# Patient Record
Sex: Female | Born: 1945 | Race: White | Hispanic: No | Smoking: Former smoker
Health system: Southern US, Community
[De-identification: ages and names within clinical notes are randomized; demographics above are authoritative.]

## PROBLEM LIST (undated history)

## (undated) DIAGNOSIS — Z800 Family history of malignant neoplasm of digestive organs: Secondary | ICD-10-CM

## (undated) DIAGNOSIS — R0609 Other forms of dyspnea: Secondary | ICD-10-CM

## (undated) DIAGNOSIS — E785 Hyperlipidemia, unspecified: Secondary | ICD-10-CM

## (undated) DIAGNOSIS — T7840XA Allergy, unspecified, initial encounter: Secondary | ICD-10-CM

## (undated) DIAGNOSIS — R519 Headache, unspecified: Secondary | ICD-10-CM

## (undated) DIAGNOSIS — E039 Hypothyroidism, unspecified: Secondary | ICD-10-CM

## (undated) DIAGNOSIS — Z8679 Personal history of other diseases of the circulatory system: Secondary | ICD-10-CM

## (undated) DIAGNOSIS — Z8041 Family history of malignant neoplasm of ovary: Secondary | ICD-10-CM

## (undated) DIAGNOSIS — G8929 Other chronic pain: Secondary | ICD-10-CM

## (undated) DIAGNOSIS — N200 Calculus of kidney: Secondary | ICD-10-CM

## (undated) DIAGNOSIS — M797 Fibromyalgia: Secondary | ICD-10-CM

## (undated) DIAGNOSIS — Z860100 Personal history of colon polyps, unspecified: Secondary | ICD-10-CM

## (undated) DIAGNOSIS — N390 Urinary tract infection, site not specified: Secondary | ICD-10-CM

## (undated) DIAGNOSIS — R0602 Shortness of breath: Secondary | ICD-10-CM

## (undated) DIAGNOSIS — R1319 Other dysphagia: Secondary | ICD-10-CM

## (undated) DIAGNOSIS — J189 Pneumonia, unspecified organism: Secondary | ICD-10-CM

## (undated) DIAGNOSIS — F419 Anxiety disorder, unspecified: Secondary | ICD-10-CM

## (undated) DIAGNOSIS — Z8042 Family history of malignant neoplasm of prostate: Secondary | ICD-10-CM

## (undated) DIAGNOSIS — F32A Depression, unspecified: Secondary | ICD-10-CM

## (undated) DIAGNOSIS — R0789 Other chest pain: Secondary | ICD-10-CM

## (undated) DIAGNOSIS — D649 Anemia, unspecified: Secondary | ICD-10-CM

## (undated) DIAGNOSIS — R51 Headache: Secondary | ICD-10-CM

## (undated) DIAGNOSIS — M1990 Unspecified osteoarthritis, unspecified site: Secondary | ICD-10-CM

## (undated) DIAGNOSIS — G43909 Migraine, unspecified, not intractable, without status migrainosus: Secondary | ICD-10-CM

## (undated) DIAGNOSIS — K219 Gastro-esophageal reflux disease without esophagitis: Secondary | ICD-10-CM

## (undated) DIAGNOSIS — H269 Unspecified cataract: Secondary | ICD-10-CM

## (undated) DIAGNOSIS — M810 Age-related osteoporosis without current pathological fracture: Secondary | ICD-10-CM

## (undated) DIAGNOSIS — K279 Peptic ulcer, site unspecified, unspecified as acute or chronic, without hemorrhage or perforation: Secondary | ICD-10-CM

## (undated) DIAGNOSIS — R0600 Dyspnea, unspecified: Secondary | ICD-10-CM

## (undated) HISTORY — DX: Peptic ulcer, site unspecified, unspecified as acute or chronic, without hemorrhage or perforation: K27.9

## (undated) HISTORY — PX: COLONOSCOPY: SHX174

## (undated) HISTORY — DX: Family history of malignant neoplasm of digestive organs: Z80.0

## (undated) HISTORY — DX: Gastro-esophageal reflux disease without esophagitis: K21.9

## (undated) HISTORY — DX: Age-related osteoporosis without current pathological fracture: M81.0

## (undated) HISTORY — DX: Headache: R51

## (undated) HISTORY — DX: Family history of malignant neoplasm of prostate: Z80.42

## (undated) HISTORY — DX: Calculus of kidney: N20.0

## (undated) HISTORY — DX: Unspecified cataract: H26.9

## (undated) HISTORY — PX: OTHER SURGICAL HISTORY: SHX169

## (undated) HISTORY — DX: Shortness of breath: R06.02

## (undated) HISTORY — DX: Pneumonia, unspecified organism: J18.9

## (undated) HISTORY — DX: Family history of malignant neoplasm of ovary: Z80.41

## (undated) HISTORY — DX: Hyperlipidemia, unspecified: E78.5

## (undated) HISTORY — DX: Personal history of colon polyps, unspecified: Z86.0100

## (undated) HISTORY — DX: Migraine, unspecified, not intractable, without status migrainosus: G43.909

## (undated) HISTORY — DX: Unspecified osteoarthritis, unspecified site: M19.90

## (undated) HISTORY — PX: TUBAL LIGATION: SHX77

## (undated) HISTORY — PX: CATARACT EXTRACTION: SUR2

## (undated) HISTORY — DX: Personal history of other diseases of the circulatory system: Z86.79

## (undated) HISTORY — DX: Urinary tract infection, site not specified: N39.0

## (undated) HISTORY — DX: Other dysphagia: R13.19

## (undated) HISTORY — PX: UPPER GASTROINTESTINAL ENDOSCOPY: SHX188

## (undated) HISTORY — DX: Depression, unspecified: F32.A

## (undated) HISTORY — DX: Anxiety disorder, unspecified: F41.9

## (undated) HISTORY — DX: Allergy, unspecified, initial encounter: T78.40XA

## (undated) HISTORY — DX: Headache, unspecified: R51.9

## (undated) HISTORY — DX: Fibromyalgia: M79.7

## (undated) HISTORY — DX: Other chronic pain: G89.29

## (undated) HISTORY — DX: Anemia, unspecified: D64.9

## (undated) HISTORY — DX: Other chest pain: R07.89

## (undated) HISTORY — DX: Dyspnea, unspecified: R06.00

## (undated) HISTORY — PX: POLYPECTOMY: SHX149

## (undated) HISTORY — DX: Hypothyroidism, unspecified: E03.9

## (undated) HISTORY — DX: Personal history of colonic polyps: Z86.010

## (undated) HISTORY — DX: Other forms of dyspnea: R06.09

---

## 1997-12-20 DIAGNOSIS — Z8679 Personal history of other diseases of the circulatory system: Secondary | ICD-10-CM

## 1997-12-20 HISTORY — DX: Personal history of other diseases of the circulatory system: Z86.79

## 1999-11-19 DIAGNOSIS — I471 Supraventricular tachycardia, unspecified: Secondary | ICD-10-CM | POA: Insufficient documentation

## 2002-04-24 DIAGNOSIS — F410 Panic disorder [episodic paroxysmal anxiety] without agoraphobia: Secondary | ICD-10-CM | POA: Insufficient documentation

## 2003-06-17 DIAGNOSIS — M549 Dorsalgia, unspecified: Secondary | ICD-10-CM | POA: Insufficient documentation

## 2005-01-18 DIAGNOSIS — M8580 Other specified disorders of bone density and structure, unspecified site: Secondary | ICD-10-CM | POA: Insufficient documentation

## 2005-07-19 ENCOUNTER — Ambulatory Visit: Payer: Self-pay | Admitting: Cardiology

## 2005-07-20 DIAGNOSIS — R0789 Other chest pain: Secondary | ICD-10-CM

## 2005-07-20 HISTORY — DX: Other chest pain: R07.89

## 2005-07-21 ENCOUNTER — Ambulatory Visit: Payer: Self-pay

## 2005-07-21 ENCOUNTER — Ambulatory Visit: Payer: Self-pay | Admitting: Cardiology

## 2006-01-04 ENCOUNTER — Ambulatory Visit: Payer: Self-pay | Admitting: Cardiology

## 2006-01-22 ENCOUNTER — Encounter: Admission: RE | Admit: 2006-01-22 | Discharge: 2006-01-22 | Payer: Self-pay | Admitting: Neurology

## 2006-05-02 ENCOUNTER — Ambulatory Visit: Payer: Self-pay | Admitting: Cardiology

## 2006-10-03 ENCOUNTER — Ambulatory Visit: Payer: Self-pay | Admitting: Cardiology

## 2006-12-09 DIAGNOSIS — M19049 Primary osteoarthritis, unspecified hand: Secondary | ICD-10-CM | POA: Insufficient documentation

## 2007-09-20 ENCOUNTER — Ambulatory Visit: Payer: Self-pay | Admitting: Cardiology

## 2007-09-20 ENCOUNTER — Encounter: Payer: Self-pay | Admitting: Physician Assistant

## 2007-10-03 ENCOUNTER — Ambulatory Visit: Payer: Self-pay | Admitting: Cardiology

## 2008-03-27 ENCOUNTER — Ambulatory Visit: Payer: Self-pay | Admitting: Cardiology

## 2008-03-27 LAB — CONVERTED CEMR LAB
ALT: 25 units/L (ref 0–35)
AST: 26 units/L (ref 0–37)
Albumin: 3.9 g/dL (ref 3.5–5.2)
Basophils Relative: 0.1 % (ref 0.0–1.0)
Eosinophils Absolute: 0.1 10*3/uL (ref 0.0–0.7)
Hemoglobin: 14.2 g/dL (ref 12.0–15.0)
Lymphocytes Relative: 47 % — ABNORMAL HIGH (ref 12.0–46.0)
MCHC: 33.2 g/dL (ref 30.0–36.0)
Monocytes Relative: 7.5 % (ref 3.0–12.0)
Neutro Abs: 2.3 10*3/uL (ref 1.4–7.7)
Platelets: 341 10*3/uL (ref 150–400)
RDW: 12.3 % (ref 11.5–14.6)
Total Bilirubin: 0.7 mg/dL (ref 0.3–1.2)
Total CK: 98 units/L (ref 7–177)
Total Protein: 7.3 g/dL (ref 6.0–8.3)

## 2008-06-05 ENCOUNTER — Ambulatory Visit: Payer: Self-pay | Admitting: Cardiology

## 2008-06-05 LAB — CONVERTED CEMR LAB
AST: 108 units/L — ABNORMAL HIGH (ref 0–37)
Albumin: 4 g/dL (ref 3.5–5.2)
Alkaline Phosphatase: 90 units/L (ref 39–117)
Bilirubin, Direct: 0.1 mg/dL (ref 0.0–0.3)
CK-MB: 2.1 ng/mL (ref 0.3–4.0)
HDL: 41.6 mg/dL (ref >39.0)
Total Bilirubin: 0.5 mg/dL (ref 0.3–1.2)
VLDL: 25 mg/dL (ref 0–40)

## 2008-06-11 ENCOUNTER — Ambulatory Visit: Payer: Self-pay | Admitting: Cardiology

## 2008-10-19 DIAGNOSIS — M25559 Pain in unspecified hip: Secondary | ICD-10-CM | POA: Insufficient documentation

## 2008-11-04 ENCOUNTER — Encounter: Payer: Self-pay | Admitting: Cardiology

## 2009-01-06 ENCOUNTER — Ambulatory Visit: Payer: Self-pay | Admitting: Gastroenterology

## 2009-01-06 DIAGNOSIS — Z860100 Personal history of colon polyps, unspecified: Secondary | ICD-10-CM | POA: Insufficient documentation

## 2009-01-06 DIAGNOSIS — R1319 Other dysphagia: Secondary | ICD-10-CM

## 2009-02-13 ENCOUNTER — Ambulatory Visit: Payer: Self-pay | Admitting: Gastroenterology

## 2009-02-13 ENCOUNTER — Ambulatory Visit (HOSPITAL_COMMUNITY): Admission: RE | Admit: 2009-02-13 | Discharge: 2009-02-13 | Payer: Self-pay | Admitting: Gastroenterology

## 2009-02-13 ENCOUNTER — Encounter: Payer: Self-pay | Admitting: Gastroenterology

## 2009-03-26 ENCOUNTER — Ambulatory Visit: Payer: Self-pay | Admitting: Cardiology

## 2009-04-02 LAB — CONVERTED CEMR LAB
Albumin: 3.9 g/dL (ref 3.5–5.2)
Bilirubin, Direct: 0 mg/dL (ref 0.0–0.3)
Cholesterol: 212 mg/dL — ABNORMAL HIGH (ref 0–200)
Direct LDL: 107.9 mg/dL
HDL: 38.1 mg/dL — ABNORMAL LOW (ref >39.00)
Triglycerides: 224 mg/dL — ABNORMAL HIGH (ref 0.0–149.0)

## 2009-06-04 ENCOUNTER — Encounter: Admission: RE | Admit: 2009-06-04 | Discharge: 2009-06-04 | Payer: Self-pay | Admitting: Obstetrics and Gynecology

## 2009-08-18 DIAGNOSIS — I471 Supraventricular tachycardia: Secondary | ICD-10-CM

## 2009-08-18 DIAGNOSIS — E785 Hyperlipidemia, unspecified: Secondary | ICD-10-CM

## 2009-08-18 DIAGNOSIS — R079 Chest pain, unspecified: Secondary | ICD-10-CM

## 2009-08-18 DIAGNOSIS — R0602 Shortness of breath: Secondary | ICD-10-CM | POA: Insufficient documentation

## 2009-10-14 ENCOUNTER — Encounter (INDEPENDENT_AMBULATORY_CARE_PROVIDER_SITE_OTHER): Payer: Self-pay | Admitting: *Deleted

## 2009-10-14 ENCOUNTER — Ambulatory Visit: Payer: Self-pay | Admitting: Cardiology

## 2009-10-14 DIAGNOSIS — G43909 Migraine, unspecified, not intractable, without status migrainosus: Secondary | ICD-10-CM | POA: Insufficient documentation

## 2009-10-17 ENCOUNTER — Ambulatory Visit: Payer: Self-pay | Admitting: Cardiology

## 2009-10-24 ENCOUNTER — Ambulatory Visit: Payer: Self-pay | Admitting: Cardiology

## 2009-10-24 ENCOUNTER — Encounter: Payer: Self-pay | Admitting: Cardiology

## 2009-11-10 ENCOUNTER — Encounter (INDEPENDENT_AMBULATORY_CARE_PROVIDER_SITE_OTHER): Payer: Self-pay | Admitting: *Deleted

## 2009-11-10 LAB — CONVERTED CEMR LAB
Alkaline Phosphatase: 107 units/L (ref 39–117)
Bilirubin, Direct: 0.1 mg/dL (ref 0.0–0.3)
Cholesterol: 162 mg/dL (ref 0–200)
HDL: 35.5 mg/dL — ABNORMAL LOW (ref >39.00)
LDL Cholesterol: 103 mg/dL — ABNORMAL HIGH (ref 0–99)
Total Bilirubin: 0.4 mg/dL (ref 0.3–1.2)
Total CHOL/HDL Ratio: 5
Triglycerides: 120 mg/dL (ref 0.0–149.0)
VLDL: 24 mg/dL (ref 0.0–40.0)

## 2010-01-27 ENCOUNTER — Encounter: Payer: Self-pay | Admitting: Gastroenterology

## 2010-02-25 ENCOUNTER — Telehealth (INDEPENDENT_AMBULATORY_CARE_PROVIDER_SITE_OTHER): Payer: Self-pay | Admitting: *Deleted

## 2010-02-27 ENCOUNTER — Encounter: Payer: Self-pay | Admitting: Gastroenterology

## 2010-02-27 ENCOUNTER — Encounter (INDEPENDENT_AMBULATORY_CARE_PROVIDER_SITE_OTHER): Payer: Self-pay | Admitting: *Deleted

## 2010-04-10 ENCOUNTER — Encounter: Payer: Self-pay | Admitting: Cardiology

## 2010-04-15 ENCOUNTER — Telehealth (INDEPENDENT_AMBULATORY_CARE_PROVIDER_SITE_OTHER): Payer: Self-pay | Admitting: *Deleted

## 2010-04-16 ENCOUNTER — Ambulatory Visit (HOSPITAL_COMMUNITY): Admission: RE | Admit: 2010-04-16 | Discharge: 2010-04-16 | Payer: Self-pay | Admitting: Gastroenterology

## 2010-04-16 ENCOUNTER — Ambulatory Visit: Payer: Self-pay | Admitting: Gastroenterology

## 2010-04-20 ENCOUNTER — Encounter: Payer: Self-pay | Admitting: Gastroenterology

## 2010-06-10 ENCOUNTER — Ambulatory Visit: Payer: Self-pay | Admitting: Cardiology

## 2010-06-10 DIAGNOSIS — G4700 Insomnia, unspecified: Secondary | ICD-10-CM | POA: Insufficient documentation

## 2010-06-19 ENCOUNTER — Encounter: Admission: RE | Admit: 2010-06-19 | Discharge: 2010-06-19 | Payer: Self-pay | Admitting: Obstetrics and Gynecology

## 2010-07-10 ENCOUNTER — Telehealth (INDEPENDENT_AMBULATORY_CARE_PROVIDER_SITE_OTHER): Payer: Self-pay | Admitting: *Deleted

## 2010-07-21 ENCOUNTER — Telehealth (INDEPENDENT_AMBULATORY_CARE_PROVIDER_SITE_OTHER): Payer: Self-pay | Admitting: *Deleted

## 2010-12-15 ENCOUNTER — Encounter: Admission: RE | Admit: 2010-12-15 | Discharge: 2010-12-15 | Payer: Self-pay | Source: Home / Self Care

## 2011-01-19 NOTE — Progress Notes (Signed)
Summary: Colon Prep question  Phone Note Other Incoming   Caller: pt walked into office Summary of Call: Pt walked into the office with a question regarding her prep for her colon, she has been doing a "flush"  for the last 24 days where she is drinking a lemon mixture and a "smooth move"  drink.  She has lost 30 pounds since the first of the year and was questioning if she need to drink the Movi prep both tonight and tomorrow.  Please advise Initial call taken by: Chales Abrahams CMA Duncan Dull),  April 15, 2010 2:54 PM  Follow-up for Phone Call        yes, she still should complete full prep Follow-up by: Rachael Fee MD,  April 15, 2010 3:06 PM  Additional Follow-up for Phone Call Additional follow up Details #1::        pt aware Additional Follow-up by: Chales Abrahams CMA Duncan Dull),  April 15, 2010 3:09 PM

## 2011-01-19 NOTE — Letter (Signed)
Summary: Appointment Reminder  [facility]  [address] [place], [place] [address]   Phone: [phone]  Fax: [phone]        February 27, 2010 MRN: [id number]    Diana Farley [address] [place], [place]  [address]    Dear [name],   We have been unable to reach you by phone to schedule a follow up   Hospital colonoscopy that was recommended for you by Dr. Christella Hartigan.  It is    important that we reach you to schedule an appointment. We hope that you  allow [place] to participate in your health care needs. Please contact us at  [phone] at your earliest convenience to schedule your appointment.     Sincerely,    Chales Abrahams CMA (AAMA)  Appended Document: Appointment Reminder letter mailed

## 2011-01-19 NOTE — Procedures (Signed)
Summary: Colonoscopy  Patient: [name] Note: All result statuses are Final unless otherwise noted.  Tests: (1) Colonoscopy (COL)   COL Colonoscopy           DONE     [facility]     [address] [place], [place]  [address]           COLONOSCOPY PROCEDURE REPORT           PATIENT:  Diana Farley, Diana Farley  MR#:  [id number]     BIRTHDATE:  September 27, 1946, 64 yrs. old  GENDER:  female     ENDOSCOPIST:  Rachael Fee, MD     PROCEDURE DATE:  04/16/2010     PROCEDURE:  Colonoscopy with snare polypectomy     ASA CLASS:  Class II     INDICATIONS:  history of pre-cancerous (adenomatous) colon polyps,     5 polyps removed last year, one was >1cm and also contained HGD     MEDICATIONS:   MAC sedation, administered by CRNA           DESCRIPTION OF PROCEDURE:   After the risks benefits and     alternatives of the procedure were thoroughly explained, informed     consent was obtained.  Digital rectal exam was performed and     revealed no rectal masses.   The  endoscope was introduced through     the anus and advanced to the cecum, which was identified by both     the appendix and ileocecal valve, without limitations.  The     quality of the prep was excellent, using MoviPrep.  The instrument     was then slowly withdrawn as the colon was fully examined.           <<PROCEDUREIMAGES>>           FINDINGS:  There were two small sessile polyps in sigmoid colon.     These measured 1-51mm across, both were removed with cold snare and     sent to pathology (jar 1) (see image4 and image5).  Melanosis coli     was found. There was mild melanosis staining throughout colon.     Abnormal appearing mucosa. The site of the rectal polyp that was     removed one year ago was clearly located (scar tissue was visible)     and there was no residual or recurrent polyp (see image7).  This     was otherwise a normal examination of the colon (see image8,     image3, and image2).   Retroflexed views in  the rectum revealed no     abnormalities.    The scope was then withdrawn from the patient     and the procedure completed.           COMPLICATIONS:  None     ENDOSCOPIC IMPRESSION:     1) Two small polyps, both removed and sent to pathology     2) Melanosis staining throughout colon     3) Scar from previous rectal polypectomy was clear of any     adenomatous mucosa     4) Otherwise normal examination           RECOMMENDATIONS:     1) You will receive a letter within 1-2 weeks with the results     of your biopsy as well as final recommendations. Please call my     office if you have not received a  letter after 3 weeks.  Likey you     will need repeat colonoscopy in 3 years.           ____________________________     Rachael Fee, MD           cc: Christena Flake, MD           n.     Rosalie Doctor:   Rachael Fee at 04/16/2010 08:15 AM           Sonnie Alamo, [id number]  Note: An exclamation mark (!) indicates a result that was not dispersed into the flowsheet. Document Creation Date: 04/16/2010 8:16 AM _______________________________________________________________________  (1) Order result status: Final Collection or observation date-time: 04/16/2010 08:01 Requested date-time:  Receipt date-time:  Reported date-time:  Referring Physician:   Ordering Physician: Rob Bunting [id number]) Specimen Source:  Source: Launa Grill Order Number: [id number] Lab site:

## 2011-01-19 NOTE — Assessment & Plan Note (Signed)
Summary: 6 MONTH FU -RECV LETTER VS   Visit Type:  Follow-up Primary Provider:  Christena Flake, MD   History of Present Illness: the patient is a 65 year old female with history of supraventricular tachycardia and atypical chest pain. The patient had a negative stress Myoview in 2010. She also is a normal echocardiogram with no valve abnormalities.she has significant dyslipidemia but this has markedly improved with exercise and medical therapy.her cholesterol is currently 110, HDL 28 LDL 63 and triglycerides 92. The patient has stopped eating red meat and she follows a strict diet which incorporates vegetables and nuts. The patient does report shortness of breath on exertion. However she feels congested and feels that she has a raspy throat. She is to take Allegra but does not take this on a q. daily basis. A significant problem however if the patient's insomnia. She only goes to bed at 2:00 in the morning and sleeps on average home repeat to 4 hours a night. She does not feel very well rested. She also exhibits some anxiety.  Preventive Screening-Counseling & Management  Alcohol-Tobacco     Smoking Status: quit     Year Quit: 1982  Current Medications (verified): 1)  Synthroid 112 Mcg Tabs (Levothyroxine Sodium) .... Take 1 Tablet By Mouth Once A Day 2)  Toprol Xl 25 Mg Xr24h-Tab (Metoprolol Succinate) .Marland Kitchen.. 1 Tablet By Mouth Once Daily (Place On File For Patient) 3)  Zoloft 100 Mg Tabs (Sertraline Hcl) .Marland Kitchen.. 1 Tablet By Mouth Once Daily 4)  Xanax 0.5 Mg Tabs (Alprazolam) .... As Needed 5)  Red Yeast Rice 600 Mg Caps (Red Yeast Rice Extract) .... Take 1 Tablet By Mouth Two Times A Day 6)  Calcium Carbonate-Vitamin D 600-400 Mg-Unit Tabs (Calcium Carbonate-Vitamin D) .Marland Kitchen.. 1 Tablet By Mouth Twice Daily 7)  Multivitamins  Tabs (Multiple Vitamin) .Marland Kitchen.. 1 Tablet By Mouth Once Daily 8)  B-12 100 Mcg Tabs (Cyanocobalamin) .Marland Kitchen.. 1 Tablet By Mouth Once Daily 9)  Lovaza 1 Gm Caps (Omega-3-Acid Ethyl  Esters) .... Take 1 Capsule By Mouth Twice A Day (Place On File For Patient) 10)  Fexofenadine Hcl 180 Mg Tabs (Fexofenadine Hcl) .... Take 1 Tablet By Mouth Once A Day As Needed 11)  Magnesium Oxide 500 Mg Tabs (Magnesium Oxide) .... Take 1 Tablet By Mouth Once A Day 12)  Black Cohosh 540 Mg Caps (Black Cohosh) .... Take 1 Tablet By Mouth Once A Day 13)  Estazolam 1 Mg Tabs (Estazolam) .... As Needed 14)  Trazodone Hcl 50 Mg Tabs (Trazodone Hcl) .... Take 1 Tab By Mouth At Bedtime 15)  Clonazepam 0.5 Mg Tbdp (Clonazepam) .... Take 1 Tab By Mouth At Bedtime  Allergies (verified): No Known Drug Allergies  Comments:  Nurse/Medical Assistant: The patient's medication list and allergies were reviewed with the patient and were updated in the Medication and Allergy Lists.  Past History:  Past Medical History: Last updated: 10/14/2009  1. History of supraventricular tachycardia.       a.     Remaining quiescent on Toprol.       b.     Initially diagnosed in 1999.   2. Atypical chest pain.       a.     Normal adequate exercise stress Myoview; ejection fraction        of 74%, August 2006.       b.     Normal 2D echocardiogram with no significant valvular        abnormalities.   3. Dyslipidemia.  a.     Hypertriglyceridemia/low HDL, on Lipitor.   4. Migraine headaches.       a.     Followed by Dr.  Porfirio Mylar Dohmeier in [place].   5. Exertional dyspnea.    DYSPNEA (ICD-786.05) HYPERLIPIDEMIA-MIXED (ICD-272.4) CHEST PAIN-UNSPECIFIED (ICD-786.50) SVT/ PSVT/ PAT (ICD-427.0) DYSPHAGIA (ICD-787.29) PERSONAL HX COLONIC POLYPS (ICD-V12.72) Arthritis Asthma Chronic Headaches Fibromyalgia Hyperthyroidism Kidney Stones Peptic Ulcer Disease Pneumonia Urinary Tract Infection  Past Surgical History: Last updated: 01/06/2009 2 C-sections  left thumb surgery  Family History: Last updated: 01/06/2009 Maternal aunt had colon cancer, colon polyps or in her family, ovarian  cancer  Social History: Last updated: 01/06/2009 she is divorced, she has 2 children, she is retired, she quit smoking over 20 years ago, she does not drink alcohol, she drinks 2-3 caffeinated beverages a day  Risk Factors: Smoking Status: quit (06/10/2010)  Review of Systems       The patient complains of weight gain/loss, shortness of breath, and anxiety.  The patient denies fatigue, malaise, fever, vision loss, decreased hearing, hoarseness, chest pain, palpitations, prolonged cough, wheezing, sleep apnea, coughing up blood, abdominal pain, blood in stool, nausea, vomiting, diarrhea, heartburn, incontinence, blood in urine, muscle weakness, joint pain, leg swelling, rash, skin lesions, headache, fainting, dizziness, depression, enlarged lymph nodes, easy bruising or bleeding, and environmental allergies.    Vital Signs:  Patient profile:   65 year old female Height:      65 inches Weight:      140 pounds Pulse rate:   80 / minute BP sitting:   97 / 61  (left arm) Cuff size:   regular  Vitals Entered By: Carlye Grippe (June 10, 2010 11:12 AM)  Physical Exam  Additional Exam:  Constitutional: generally well appearing Psychiatric: alert and oriented times 3 Eyes: extraocular movements intact Mouth: oropharynx moist, no lesions Neck: supple, no lymphadenopathy Cardiovascular: heart regular rate and rythm Lungs: CTA bilaterally Abdomen: soft, non-tender, non-distended, no obvious ascites, no peritoneal signs, normal bowel sounds Extremities: no lower extremity edema bilaterally Skin: no lesions on visible extremities    Impression & Recommendations:  Problem # 1:  INSOMNIA (ICD-780.52) I told the patient that she needs to restart her her sleep pattern. She needs to go to bed at an earlier time and added trazodone to her medical regimen. I also switched her from Xanax to clonazepam b.i.d.  Problem # 2:  CHEST PAIN-UNSPECIFIED (ICD-786.50) no evidence of significant coronary  artery disease. Continue medical therapy Her updated medication list for this problem includes:    Toprol Xl 25 Mg Xr24h-tab (Metoprolol succinate) .Marland Kitchen... 1 tablet by mouth once daily (place on file for patient)  Problem # 3:  HYPERLIPIDEMIA-MIXED (ICD-272.4) dyslipidemia has improved significantly. The patient can continue with lifestyle modification, Lovaza and red yeast rice Her updated medication list for this problem includes:    Lovaza 1 Gm Caps (Omega-3-acid ethyl esters) .Marland Kitchen... Take 1 capsule by mouth twice a day (place on file for patient)  Patient Instructions: 1)  Trazodone 2)  Clonazepam 3)  Stop Xanax 4)  Follow up in  6 months Prescriptions: CLONAZEPAM 0.5 MG TBDP (CLONAZEPAM) Take 1 tab by mouth at bedtime  #30 x 2   Entered by:   Hoover Brunette, LPN   Authorized by:   Lewayne Bunting, MD, [facility]   Signed by:   Hoover Brunette, LPN on 09/81/1914   Method used:   Print then Give to Patient   RxID:   [id number] TRAZODONE HCL 50  MG TABS (TRAZODONE HCL) Take 1 tab by mouth at bedtime  #30 x 2   Entered by:   Hoover Brunette, LPN   Authorized by:   Lewayne Bunting, MD, [facility]   Signed by:   Hoover Brunette, LPN on 54/08/8118   Method used:   Print then Give to Patient   RxID:   [id number] LOVAZA 1 GM CAPS (OMEGA-3-ACID ETHYL ESTERS) Take 1 capsule by mouth twice a day (Place on file for patient)  #180 x 3   Entered by:   Hoover Brunette, LPN   Authorized by:   Lewayne Bunting, MD, [facility]   Signed by:   Hoover Brunette, LPN on 84/69/6295   Method used:   Electronically to        [facility] (mail-order)       [facility]       [address]       [place], [place]  [address]       Ph: [phone]       Fax: [phone]   RxID:   [id number] TOPROL XL 25 MG XR24H-TAB (METOPROLOL SUCCINATE) 1 tablet by mouth once daily (Place on file for patient)  #90 x 3   Entered by:   Hoover Brunette, LPN   Authorized by:   Lewayne Bunting, MD, [facility]   Signed by:   Hoover Brunette, LPN on 18/84/1660    Method used:   Electronically to        [facility] (mail-order)       [facility]       [address]       [place], [place]  [address]       Ph: [phone]       Fax: [phone]   RxID:   [id number]

## 2011-01-19 NOTE — Progress Notes (Signed)
Summary: Colon  Phone Note Outgoing Call Call back at Home Phone [phone]   Call placed by: Chales Abrahams CMA Duncan Dull),  February 25, 2010 9:07 AM Summary of Call: called to schedule repeat Colon left message on machine to call back  Initial call taken by: Chales Abrahams CMA Duncan Dull),  February 25, 2010 9:07 AM  Follow-up for Phone Call        left message on machine to call back Chales Abrahams CMA Duncan Dull)  February 26, 2010 9:17 AM   left message on machine to call back letter mailed. Follow-up by: Chales Abrahams CMA Duncan Dull),  February 27, 2010 9:34 AM     Appended Document: Colon pt daughter called Maurine Simmering) and scheduled appt and was instructed.  She was a Engineer, civil (consulting) at [employer] in [place] and understood all instructions.  Will be mailed to the home.  Pre op 04/15/10 1 pm

## 2011-01-19 NOTE — Progress Notes (Signed)
Summary: ? increase in sleep med   Phone Note Call from Patient   Caller: Sonnie Alamo Reason for Call: Talk to Nurse Details for Reason: adjust meds Summary of Call: Maryem said that Dr. Andee Lineman on her last visit indicated that some of her meds may need to be adjusted if she is not resting well.  She said that the Trazodone may be effecting her sleep.  She would like to talk to [facility] nurse to determine if the med. can be adjusted to a lower dose. Call Eber Jones at [phone] Initial call taken by: Alexis Goodell  Follow-up for Phone Call        States she is still not sleeping good.  Trying to go to bed earlier to reset sleep pattern, but could not go to sleep till about 4 a.m. last Thursday.  States that you two discussed increasing med to 100mg  on the Trazodone.  If decide to keep on for a longer period of time, will want 90 day supply thru [facility].  Hoover Brunette, LPN  July 13, 2010 1:31 PM   Additional Follow-up for Phone Call Additional follow up Details #1::        OK to increase Trazodone to 100mg  by mouth at bedtime for insomnia.  Additional Follow-up by: Lewayne Bunting, MD, [facility],  July 13, 2010 2:38 PM    Additional Follow-up for Phone Call Additional follow up Details #2::      New/Updated Medications: TRAZODONE HCL 100 MG TABS (TRAZODONE HCL) Take 1 tab by mouth at bedtime Prescriptions: TRAZODONE HCL 100 MG TABS (TRAZODONE HCL) Take 1 tab by mouth at bedtime  #90 x 0   Entered by:   Hoover Brunette, LPN   Authorized by:   Lewayne Bunting, MD, [facility]   Signed by:   Hoover Brunette, LPN on 09/81/1914   Method used:   Faxed to ...       [facility] Environmental education officer)       [facility]       [address]       [place], [place]  [address]       Ph: [phone]       Fax: [phone]   RxID:   [id number]

## 2011-01-19 NOTE — Letter (Signed)
Summary: Results Letter  [facility]  [address] [place], [place] [address]   Phone: [phone]  Fax: [phone]        Apr 20, 2010 MRN: [id number]    Diana Farley [address] [place], [place]  [address]    Dear Ms. Mabry,   The polyp(s) removed during your recent procedure were proven to be adenomatous.  These are pre-cancerous polyps that may have grown into cancers if they had not been removed.  Based on current nationally recognized surveillance guidelines, I recommend that you have a repeat colonoscopy in 3 years.   We will therefore put your information in our reminder system and will contact you in 3 years to schedule a repeat procedure.  Please call if you have any questions or concerns.       Sincerely,  Rachael Fee MD  This letter has been electronically signed by your physician.

## 2011-01-19 NOTE — Letter (Signed)
Summary: [facility] Instructions  [facility]  [address] [place], [place] [address]   Phone: [phone]  Fax: [phone]       Diana Farley    [birth year]    MRN: [id number]        Procedure Day /Date:04/16/10     Arrival Time:630 am     Procedure Time:730 am     Location of Procedure:                     X  [facility] ( Outpatient Registration)                        PREPARATION FOR COLONOSCOPY WITH MOVIPREP   Starting 5 days prior to your procedure 04/11/10  do not eat nuts, seeds, popcorn, corn, beans, peas,  salads, or any raw vegetables.  Do not take any fiber supplements (e.g. Metamucil, Citrucel, and Benefiber).  THE DAY BEFORE YOUR PROCEDURE         DATE: 04/15/10  DAY: WED  1.  Drink clear liquids the entire day-NO SOLID FOOD  2.  Do not drink anything colored red or purple.  Avoid juices with pulp.  No orange juice.  3.  Drink at least 64 oz. (8 glasses) of fluid/clear liquids during the day to prevent dehydration and help the prep work efficiently.  CLEAR LIQUIDS INCLUDE: Water Jello Ice Popsicles Tea (sugar ok, no milk/cream) Powdered fruit flavored drinks Coffee (sugar ok, no milk/cream) Gatorade Juice: apple, white grape, white cranberry  Lemonade Clear bullion, consomm, broth Carbonated beverages (any kind) Strained chicken noodle soup Hard Candy                             4.  In the morning, mix first dose of MoviPrep solution:    Empty 1 Pouch A and 1 Pouch B into the disposable container    Add lukewarm drinking water to the top line of the container. Mix to dissolve    Refrigerate (mixed solution should be used within 24 hrs)  5.  Begin drinking the prep at 5:00 p.m. The MoviPrep container is divided by 4 marks.   Every 15 minutes drink the solution down to the next mark (approximately 8 oz) until the full liter is complete.   6.  Follow completed prep with 16 oz of clear liquid of your choice (Nothing red or  purple).  Continue to drink clear liquids until bedtime.  7.  Before going to bed, mix second dose of MoviPrep solution:    Empty 1 Pouch A and 1 Pouch B into the disposable container    Add lukewarm drinking water to the top line of the container. Mix to dissolve    Refrigerate  THE DAY OF YOUR PROCEDURE      DATE: 04/16/10 DAY: THURS  Beginning at 230 a.m. (5 hours before procedure):         1. Every 15 minutes, drink the solution down to the next mark (approx 8 oz) until the full liter is complete.  2. Follow completed prep with 16 oz. of clear liquid of your choice.    3. Nothing to eat or drink after midnight except contrast.   MEDICATION INSTRUCTIONS  Unless otherwise instructed, you should take regular prescription medications with a small sip of water   as early as possible the morning of your procedure.  OTHER INSTRUCTIONS  You will need a responsible adult at least 64 years of age to accompany you and drive you home.   This person must remain in the waiting room during your procedure.  Wear loose fitting clothing that is easily removed.  Leave jewelry and other valuables at home.  However, you may wish to bring a book to read or  an iPod/MP3 player to listen to music as you wait for your procedure to start.  Remove all body piercing jewelry and leave at home.  Total time from sign-in until discharge is approximately 2-3 hours.  You should go home directly after your procedure and rest.  You can resume normal activities the  day after your procedure.  The day of your procedure you should not:   Drive   Make legal decisions   Operate machinery   Drink alcohol   Return to work  You will receive specific instructions about eating, activities and medications before you leave.    The above instructions have been reviewed and explained to me by   Chales Abrahams CMA Duncan Dull)  February 27, 2010 1:48 PM     I fully understand and can verbalize  these instructions over the phone mailed to [facility] 02/27/10

## 2011-01-19 NOTE — Procedures (Signed)
Summary: Recall / [facility]  Recall / [facility]   Imported By: Lennie Odor 05/21/2010 15:40:52  _____________________________________________________________________  External Attachment:    Type:   Image     Comment:   External Document

## 2011-01-19 NOTE — Progress Notes (Signed)
Summary: DOSE INCREASE TRAZODONE  Phone Note Outgoing Call   Call placed by: Carlye Grippe,  July 21, 2010 11:40 AM Call placed to: Patient Summary of Call: PER MD DEGENT,  he spoke with daughter and increased trazodone to 150mg  at bedtime. Initial call taken by: Carlye Grippe,  July 21, 2010 11:40 AM    New/Updated Medications: TRAZODONE HCL 50 MG TABS (TRAZODONE HCL) Take 3 tablet by mouth once a day Prescriptions: TRAZODONE HCL 50 MG TABS (TRAZODONE HCL) Take 3 tablet by mouth once a day  #90 x 6   Entered by:   Carlye Grippe   Authorized by:   Lewayne Bunting, MD, [facility]   Signed by:   Carlye Grippe on 07/21/2010   Method used:   Electronically to        [facility]* (retail)       [address]       [place], [place]  [address]       Ph: [phone]       Fax: [phone]   RxID:   [id number]

## 2011-01-19 NOTE — Miscellaneous (Signed)
Summary: Orders Update/Colon  Clinical Lists Changes  Orders: Added new Test order of ZCOL (ZCOL) - Signed

## 2011-02-08 ENCOUNTER — Ambulatory Visit: Payer: Self-pay | Admitting: Cardiology

## 2011-03-08 ENCOUNTER — Encounter: Payer: Self-pay | Admitting: Cardiology

## 2011-03-10 ENCOUNTER — Ambulatory Visit (INDEPENDENT_AMBULATORY_CARE_PROVIDER_SITE_OTHER): Payer: BC Managed Care – PPO | Admitting: Cardiology

## 2011-03-10 ENCOUNTER — Encounter: Payer: Self-pay | Admitting: Cardiology

## 2011-03-10 VITALS — BP 100/67 | HR 78 | Ht 64.0 in | Wt 145.0 lb

## 2011-03-10 DIAGNOSIS — I369 Nonrheumatic tricuspid valve disorder, unspecified: Secondary | ICD-10-CM

## 2011-03-10 DIAGNOSIS — I079 Rheumatic tricuspid valve disease, unspecified: Secondary | ICD-10-CM | POA: Insufficient documentation

## 2011-03-10 DIAGNOSIS — E785 Hyperlipidemia, unspecified: Secondary | ICD-10-CM

## 2011-03-10 DIAGNOSIS — I471 Supraventricular tachycardia: Secondary | ICD-10-CM

## 2011-03-10 DIAGNOSIS — G4700 Insomnia, unspecified: Secondary | ICD-10-CM

## 2011-03-10 MED ORDER — OMEGA-3-ACID ETHYL ESTERS 1 G PO CAPS: 1.0000 g | ORAL_CAPSULE | Freq: Two times a day (BID) | ORAL | Status: DC

## 2011-03-10 NOTE — Assessment & Plan Note (Signed)
Patient reports no recurrence of supraventricular tachycardia.

## 2011-03-10 NOTE — Assessment & Plan Note (Signed)
Reviewed the patient's lipid panel today.  The patient has no known coronary artery disease.  Continue primary prevention With  red yeast rice and fish oil

## 2011-03-10 NOTE — Assessment & Plan Note (Signed)
Result of trazodone.  The patient has called in her current prescriptions.  We have given refills for this.

## 2011-03-10 NOTE — Progress Notes (Signed)
HPI The patient is a 65 year old female with a history of supraventricular tachycardia and atypical chest pain.  She had a negative stress Myoview in 2010.  She also normal echocardiogram with no valvular abnormalities.  Chest significant dyslipidemia but this has markedly improved with exercise and medical therapy.  She stopped eating  red meat and follow a strict diet incorporate vegetables and nuts. Her supraventricular tachycardia remains questionable Toprol.  She has history of migraine headaches and insomnia. Patient is concerned about her cholesterol.  We reviewed her lipid panel.  Her LDL is 144.  Total cholesterol 215 HDL actually improved from 28 to 44. From a cardiovascular standpoint she is doing well she denies any chest pain or shortness of breath.  She is a little bit less compulsive with her diet.  Should also has some difficulty with exercise because of a foot problem. Her EKG was reviewed and was found to be within normal limits. The patient reminded me that she saw Dr. Samule Ohm at some point in time in the past and told her that something was wrong with her tricuspid valve.  However on physical examination there is no evidence of a significant murmur.  No Known Allergies  Current Outpatient Prescriptions on File Prior to Visit  Medication Sig Dispense Refill  . Black Cohosh 540 MG CAPS Take 1 capsule by mouth daily.        . Calcium Carbonate-Vitamin D 600-400 MG-UNIT per tablet Take 1 tablet by mouth 2 (two) times daily.        . clonazePAM (KLONOPIN) 0.5 MG tablet Take 0.5 mg by mouth at bedtime.        . cyanocobalamin 100 MCG tablet Take 100 mcg by mouth daily.        . fexofenadine (ALLEGRA) 180 MG tablet Take 180 mg by mouth daily as needed.        Marland Kitchen levothyroxine (SYNTHROID, LEVOTHROID) 112 MCG tablet Take 112 mcg by mouth daily.        . Magnesium Oxide 500 MG TABS Take 1 tablet by mouth daily.        . metoprolol (TOPROL-XL) 25 MG 24 hr tablet Take 25 mg by mouth daily.  Place on file for patient.       . MULTIPLE VITAMINS PO Take 1 tablet by mouth daily.        Marland Kitchen omega-3 acid ethyl esters (LOVAZA) 1 G capsule Take one by mouth twice daily       . Red Yeast Rice Extract 600 MG CAPS Take 1 capsule by mouth 2 (two) times daily.        . sertraline (ZOLOFT) 100 MG tablet Take 100 mg by mouth daily.        . traZODone (DESYREL) 50 MG tablet Take 3 by mouth daily       . DISCONTD: ALPRAZolam (XANAX) 0.5 MG tablet Take 0.5 mg by mouth as needed.        Marland Kitchen DISCONTD: estazolam (PROSOM) 1 MG tablet Take 1 mg by mouth as needed.        Marland Kitchen DISCONTD: traZODone (DESYREL) 100 MG tablet Take 100 mg by mouth daily.          Past Medical History  Diagnosis Date  . History of supraventricular tachycardia 1999  . Atypical chest pain Aug. 2006    normal adequate exercise stress Moview; EF 74%, normal 2D echo w/ no significant valvular abnormalities  . Dyslipidemia     hypertriglyceridemia/low HDL, on Lipitor.  Marland Kitchen  Migraine headache     followed by Dr. Porfirio Mylar Dohmeier in [place]  . Exertional dyspnea   . Shortness of breath   . Other and unspecified hyperlipidemia   . Chest pain   . Paroxysmal supraventricular tachycardia   . Other dysphagia   . Personal history of colonic polyps   . Arthritis   . Asthma   . Chronic headaches   . Fibromyalgia   . Hyperthyroidism   . Kidney stones   . Peptic ulcer disease   . Pneumonia   . UTI (urinary tract infection)     Past Surgical History  Procedure Date  . Cesarean section     Two  . Left thumb surgery     Family History  Problem Relation Age of Onset  . Colon cancer Maternal Aunt   . Colon polyps Maternal Aunt   . Ovarian cancer      History   Social History  . Marital Status: Widowed    Spouse Name: N/A    Number of Children: N/A  . Years of Education: N/A   Occupational History  . Not on file.   Social History Main Topics  . Smoking status: Former Smoker    Types: Cigarettes    Quit date: 05/20/1981  .  Smokeless tobacco: Not on file  . Alcohol Use: No  . Drug Use: Not on file  . Sexually Active: Not on file   Other Topics Concern  . Not on file   Social History Narrative  . No narrative on file   The patient denies fatigue, malaise, fever, weight gain/loss, vision loss, decreased hearing, hoarseness, chest pain, palpitations, shortness of breath, prolonged cough, wheezing, sleep apnea, coughing up blood, abdominal pain, blood in stool, nausea, vomiting, diarrhea, heartburn, incontinence, blood in urine, muscle weakness, joint pain, leg swelling, rash, skin lesions, headache, fainting, dizziness, depression, anxiety, enlarged lymph nodes, easy bruising or bleeding, and environmental allergies.    PHYSICAL EXAM BP 100/67  Pulse 78  Ht 5\' 4"  (1.626 m)  Wt 145 lb (65.772 kg)  BMI 24.89 kg/m2  General: Well-developed, well-nourished in no distress Head: Normocephalic and atraumatic Eyes:PERRLA/EOMI intact, conjunctiva and lids normal Ears: No deformity or lesions Mouth:normal dentition, normal posterior pharynx Neck: Supple, no JVD.  No masses, thyromegaly or abnormal cervical nodes Lungs: Normal breath sounds bilaterally without wheezing.  Normal percussion Cardiac: regular rate and rhythm with normal S1 and S2, no S3 or S4.  PMI is normal.  No pathological murmurs Abdomen: Normal bowel sounds, abdomen is soft and nontender without masses, organomegaly or hernias noted.  No hepatosplenomegaly MSK: Back normal, normal gait muscle strength and tone normal Vascular: Pulse is normal in all 4 extremities Extremities: No peripheral pitting edema Neurologic: Alert and oriented x 3 Skin: Intact without lesions or rashes Lymphatics: No significant adenopathyPsychologic: Normal affect  ZOX:WRUEAV sinus rhythm.  Normal EKG heart rate 73 beats/min Bedside limited echocardiogram:Bedside echocardiogram was essentially within normal limits.  See details below  ASSESSMENT AND PLAN

## 2011-03-10 NOTE — Assessment & Plan Note (Signed)
I did a bedside echocardiogram.  The patient had normal LV size and contraction.  There were no wall motion abnormalities.  Aortic mitral and tricuspid valve were all within normal limits.

## 2011-04-06 LAB — BASIC METABOLIC PANEL
BUN: 5 mg/dL — ABNORMAL LOW (ref 6–23)
Chloride: 111 mEq/L (ref 96–112)
Potassium: 3.1 mEq/L — ABNORMAL LOW (ref 3.5–5.1)

## 2011-04-06 LAB — CBC
Hemoglobin: 12.9 g/dL (ref 12.0–15.0)
MCHC: 34.2 g/dL (ref 30.0–36.0)
RBC: 4.04 MIL/uL (ref 3.87–5.11)
WBC: 5.3 10*3/uL (ref 4.0–10.5)

## 2011-05-04 NOTE — Assessment & Plan Note (Signed)
[facility]                          [facility] OFFICE NOTE   NAME:Diana Farley, Diana Farley                       MRN:          [id number]  DATE:09/20/2007                            DOB:          [birth year]    PRIMARY CARDIOLOGIST:  Learta Codding, MD,FACC (new)   REASON FOR VISIT:  Ms. Stillion is a very pleasant 65 year old female with  history of SVT and no documented history of coronary artery disease, who  presents today to establish with Dr.  Andee Lineman here in our [facility].  She was formerly followed by Dr.  Randa Evens, in [place].   The patient presents with a cardiac history notable for SVT in 1999,  initially diagnosed in [place], [place], by Dr.  Mayford Knife. This has  been successfully treated with medication and she continues to remain on  Toprol XL 50 mg daily. She has had prior ischemic workup with a normal  exercise stress Myoview in August 2006, at which time she also had a  normal 2D echocardiogram revealing normal left ventricular function and  no significant valvular abnormalities. She also had a reportedly  negative 24 hour Holter monitor, per Dr. Samule Ohm.   Since last seen in the clinic by Dr.  Samule Ohm in January 2007, the  patient continues to do well with no significant worsening of her  occasional palpitations. In fact, she tells me that she has been doing  great. However, on the other hand she also seems to suggest that she  has lost some exercise tolerance and has occasional chest discomfort  with activity. Despite this, she continues to exercise on an occasional  basis apparently with no associated symptoms.   The patient has been on medication for cholesterol lowering for some  time now, initially prescribed by Dr.  Samule Ohm. She had some recent  surveillance blood work which was clearly incorrect, reporting a  triglyceride level of 990 with a total cholesterol of 239, HDL 29 and  LDL of 11.5. This was followed by a recent  repeat profile revealing  triglycerides of 260, total cholesterol of 165, HDL 36.6 and LDL 76.   Electrocardiogram today reveals normal sinus rhythm at 80 beat per  minute with normal axis and poor R-wave progression.   CURRENT MEDICATIONS:  1. Aspirin 81 daily.  2. Toprol XL 50 mg q a.m./p.r.n. 50 mg q p.m.  3. Synthroid 0.150 mg daily.  4. Zoloft 100 mg daily.  5. Omega 3 fish oil 1200 mg daily.  6. Boniva once monthly.  7. Xanax two tablets nightly.   PHYSICAL EXAMINATION:  Weight 170.4. Blood pressure 104/70, pulse 83 and  regular.  GENERAL: A 65 year old female sitting upright in no distress.  HEENT: Normocephalic, atraumatic.  NECK: Palpable bilateral carotid pulses without bruits; no JVD.  LUNGS:  Clear to auscultation in all fields.  HEART: Regular rate and rhythm (S1, S2). No murmurs, rubs or gallops.  ABDOMEN: Benign.  EXTREMITIES: Palpable pulses without edema.  NEURO: No focal deficit.   IMPRESSION:  1. History of supraventricular tachycardia.      a.  Remaining quiescent on Toprol.      b.     Initially diagnosed in 1999.  2. Atypical chest pain.      a.     Normal adequate exercise stress Myoview; ejection fraction       of 74%, August 2006.      b.     Normal 2D echocardiogram with no significant valvular       abnormalities.  3. Dyslipidemia.      a.     Hypertriglyceridemia/low HDL, on Lipitor.  4. Migraine headaches.      a.     Followed by Dr.  Porfirio Mylar Dohmeier in [place].  5. Exertional dyspnea.   PLAN:  Schedule baseline 2D echocardiogram as well as an exercise stress  echocardiogram for reassessment of exertional dyspnea and chest  discomfort. If these studies are negative, then no further cardiac  testing is indicated and we can have the patient return for regular  followup with Dr.  Andee Lineman in six months.      Gene Serpe, PA-C  Electronically Signed      Learta Codding, MD,FACC  Electronically Signed   GS/MedQ  DD: 09/20/2007  DT:  09/20/2007  Job #: [id number]   cc:   Christena Flake, MD

## 2011-06-25 ENCOUNTER — Other Ambulatory Visit: Payer: Self-pay | Admitting: Obstetrics and Gynecology

## 2011-06-25 DIAGNOSIS — Z1231 Encounter for screening mammogram for malignant neoplasm of breast: Secondary | ICD-10-CM

## 2011-06-30 ENCOUNTER — Ambulatory Visit: Payer: BC Managed Care – PPO

## 2011-07-12 ENCOUNTER — Ambulatory Visit: Payer: BC Managed Care – PPO

## 2011-09-22 ENCOUNTER — Telehealth: Payer: Self-pay | Admitting: *Deleted

## 2011-09-22 NOTE — Telephone Encounter (Signed)
Want to make sure it is okay for patient to get massage on current medications.  Discussed with Dr. Andee Lineman - no contraindication for patient to receive massage on current medications. Will fax note to:  [phone].  Koleen Nimrod notified.

## 2012-01-06 ENCOUNTER — Telehealth: Payer: Self-pay | Admitting: *Deleted

## 2012-01-06 NOTE — Telephone Encounter (Signed)
Patient had question about letter received from [employer].  Stated that she could switch to a generic option on her cholesterol medication.  Currently on Lovaza.  Her letter suggested Fenofibrate (Tricor).  She could get 90 day supply free.  Advised her that they are in same category of meds, but not the same thing.  She stated that she thought she had tried this in the past, but could not take for some reason.  Advised her to continue current med until OV on 2/28 & then can discuss further with MD at that time.   She verbalized understanding.

## 2012-02-17 ENCOUNTER — Encounter: Payer: Self-pay | Admitting: Cardiology

## 2012-02-17 ENCOUNTER — Ambulatory Visit (INDEPENDENT_AMBULATORY_CARE_PROVIDER_SITE_OTHER): Payer: BC Managed Care – PPO | Admitting: Cardiology

## 2012-02-17 VITALS — BP 100/66 | HR 67 | Ht 64.0 in | Wt 136.0 lb

## 2012-02-17 DIAGNOSIS — I471 Supraventricular tachycardia: Secondary | ICD-10-CM

## 2012-02-17 DIAGNOSIS — F419 Anxiety disorder, unspecified: Secondary | ICD-10-CM

## 2012-02-17 NOTE — Patient Instructions (Signed)
Your physician recommends that you schedule a follow-up appointment in: 1 year. You will receive a reminder letter in the mail about 1-2 months in advance. If you don't receive this letter, please contact our office.  Your physician recommends that you continue on your current medications as directed. Please refer to the Current Medication list given to you today.

## 2012-02-20 ENCOUNTER — Encounter: Payer: Self-pay | Admitting: Cardiology

## 2012-02-20 DIAGNOSIS — F419 Anxiety disorder, unspecified: Secondary | ICD-10-CM | POA: Insufficient documentation

## 2012-02-20 NOTE — Progress Notes (Signed)
Diana Bottoms, MD, [facility] ABIM Board Certified in Adult Cardiovascular Medicine,Internal Medicine and Critical Care Medicine    CC: followup patient with supraventricular tachycardia and atypical chest pain  HPI:  The patient is a 66 year old female with a history of anxiety, atypical chest pain and supraventricular tachycardia.  Myoview study was negative for ischemia in 2010.  The patient is going through a stressful period in her life again but seems to be coping well.  She was recently placed on Cymbalta but feels like she is not doing well on this.  Her sleep pattern is stable.  She reports no recurrent palpitations.  In the past she had significant dyslipidemia but this has improved significantly. From a cardiac standpoint she is otherwise stable and reports no orthopnea PND.  She reports no presyncope or syncope.   PMH: reviewed and listed in Problem List in Electronic Records (and see below) Past Medical History  Diagnosis Date  . History of supraventricular tachycardia 1999  . Atypical chest pain Aug. 2006    normal adequate exercise stress Moview; EF 74%, normal 2D echo w/ no significant valvular abnormalities  . Dyslipidemia     hypertriglyceridemia/low HDL, on Lipitor.  . Migraine headache     followed by Dr. Porfirio Mylar Dohmeier in [place]  . Exertional dyspnea   . Shortness of breath   . Other and unspecified hyperlipidemia   . Chest pain   . Other dysphagia   . Personal history of colonic polyps   . Arthritis   . Asthma   . Chronic headaches   . Fibromyalgia   . Hyperthyroidism   . Kidney stones   . Peptic ulcer disease   . Pneumonia   . UTI (urinary tract infection)   . Anxiety    Past Surgical History  Procedure Date  . Cesarean section     Two  . Left thumb surgery     Allergies/SH/FHX : available in Electronic Records for review  No Known Allergies History   Social History  . Marital Status: Widowed    Spouse Name: N/A    Number of Children: N/A  .  Years of Education: N/A   Occupational History  . Not on file.   Social History Main Topics  . Smoking status: Former Smoker -- 1.5 packs/day for 17 years    Types: Cigarettes    Quit date: 05/20/1981  . Smokeless tobacco: Never Used  . Alcohol Use: No  . Drug Use: Not on file  . Sexually Active: Not on file   Other Topics Concern  . Not on file   Social History Narrative  . No narrative on file   Family History  Problem Relation Age of Onset  . Colon cancer Maternal Aunt   . Colon polyps Maternal Aunt   . Ovarian cancer      Medications: Current Outpatient Prescriptions  Medication Sig Dispense Refill  . Black Cohosh 540 MG CAPS Take 1 capsule by mouth daily.        . Calcium Carbonate-Vitamin D 600-400 MG-UNIT per tablet Take 1 tablet by mouth 2 (two) times daily.        . clonazePAM (KLONOPIN) 0.5 MG tablet Take 0.5 mg by mouth at bedtime.        . cyanocobalamin 100 MCG tablet Take 100 mcg by mouth daily as needed.       . CYMBALTA 30 MG capsule Take 1 capsule by mouth Daily.      . fexofenadine (ALLEGRA) 180  MG tablet Take 180 mg by mouth daily as needed.        Marland Kitchen HYDROcodone-acetaminophen (LORCET) 10-650 MG per tablet Take 1 tablet by mouth Every 6 hours as needed.      Marland Kitchen levothyroxine (SYNTHROID, LEVOTHROID) 112 MCG tablet Take 112 mcg by mouth daily.        . Magnesium Oxide 500 MG TABS Take 2 tablets by mouth daily.       . metoprolol (TOPROL-XL) 25 MG 24 hr tablet Take 25 mg by mouth daily. Place on file for patient.       . MULTIPLE VITAMINS PO Take 1 tablet by mouth daily.        Marland Kitchen omega-3 acid ethyl esters (LOVAZA) 1 G capsule Take 1 capsule (1 g total) by mouth 2 (two) times daily. Take one by mouth twice daily   90 capsule  3  . Red Yeast Rice Extract 600 MG CAPS Take 1 capsule by mouth 2 (two) times daily.        Marland Kitchen rOPINIRole (REQUIP) 1 MG tablet Take 2 tablets by mouth At bedtime.      . traZODone (DESYREL) 50 MG tablet Take 150 mg by mouth at bedtime.  Take 3 by mouth daily         ROS: No nausea or vomiting. No fever or chills.No melena or hematochezia.No bleeding.No claudication  Physical Exam: BP 100/66  Pulse 67  Ht 5\' 4"  (1.626 m)  Wt 136 lb (61.689 kg)  BMI 23.34 kg/m2 General:well-nourished white female in no distress Neck:NA Lungs:clear breath sounds bilaterally Cardiac:rectal rate and rhythm with no pathological murmurs Vascular:NA Skin:warm and dry Physcologic:normal affect  12lead ECG: Limited bedside ECHO:N/A No images are attached to the encounter.  Counseling was provided regarding the current medical condition and included: . Diagnosis, impressions, prognosis, recommended diagnostic studies  . Risks and benefits of treatment options  . Instructions for management, treatment and/or follow-up care  . Importance of compliance with treatment, risk factor reduction  . Patient and/or family education    Time spent counseling was 15 minutes on the various antianxiety and antidepressant medications that could be available to her. I recommended that she for discussed this with her primary care physician because clearly she is not doing very well on Cymbalta.  Patient Active Problem List  Diagnoses  . HYPERLIPIDEMIA-MIXED-improved on medical therapy  . MIGRAINE HEADACHE  . SVT/ PSVT/ PATno recurrence on beta blocker therapy  . INSOMNIA  . DYSPNEA  . CHEST PAIN-UNSPECIFIED  . DYSPHAGIA  . PERSONAL HX COLONIC POLYPS  . 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 Anxiety    PLAN   No recurrent palpitations.  Patient can continue on her current medical therapy.this includes beta blocker therapy.  After long discussion with the patient about the possible options regarding antianxiety and antidepressant medications.  I counseled her about the various risks benefits and side effects of potentially available drugs.  She states that she will further discuss this with her primary  care physician.  From a cardiac standpoint she is stable and she needs no further ischemia workup.

## 2012-02-21 ENCOUNTER — Encounter: Payer: Self-pay | Admitting: *Deleted

## 2012-11-06 ENCOUNTER — Telehealth: Payer: Self-pay | Admitting: Gastroenterology

## 2012-11-06 NOTE — Telephone Encounter (Signed)
Left message on machine to call back regarding recall for 04/2013

## 2012-11-07 NOTE — Telephone Encounter (Signed)
Pt aware and will wait for a letter closer to time to schedule the procedure

## 2013-04-02 ENCOUNTER — Encounter: Payer: Self-pay | Admitting: Gastroenterology

## 2013-04-30 ENCOUNTER — Telehealth: Payer: Self-pay | Admitting: Gastroenterology

## 2013-05-01 NOTE — Telephone Encounter (Signed)
Left message on machine to call back  

## 2013-05-02 NOTE — Telephone Encounter (Signed)
Pt has been scheduled for pre visit and colon  

## 2013-05-18 ENCOUNTER — Ambulatory Visit (AMBULATORY_SURGERY_CENTER): Payer: Medicare Other | Admitting: *Deleted

## 2013-05-18 VITALS — Ht 65.0 in | Wt 141.0 lb

## 2013-05-18 DIAGNOSIS — Z860100 Personal history of colon polyps, unspecified: Secondary | ICD-10-CM

## 2013-05-18 MED ORDER — PEG-KCL-NACL-NASULF-NA ASC-C 100 G PO SOLR: ORAL | Status: DC

## 2013-05-21 ENCOUNTER — Telehealth: Payer: Self-pay | Admitting: *Deleted

## 2013-05-21 NOTE — Telephone Encounter (Signed)
Pt had left her prep instructions by the coffee machine after her PV on 05-18-13.  I had called and left a message on her cell phone that she had done that and I placed by the receptionist's computer.  I called today to make sure she had picked them up and she had.  She also had a couple of questions about her prep which I answered.

## 2013-05-25 ENCOUNTER — Ambulatory Visit (AMBULATORY_SURGERY_CENTER): Payer: Medicare Other | Admitting: Gastroenterology

## 2013-05-25 ENCOUNTER — Encounter: Payer: Self-pay | Admitting: Gastroenterology

## 2013-05-25 VITALS — BP 110/67 | HR 57 | Temp 96.4°F | Resp 59 | Ht 65.0 in | Wt 141.0 lb

## 2013-05-25 DIAGNOSIS — D126 Benign neoplasm of colon, unspecified: Secondary | ICD-10-CM

## 2013-05-25 DIAGNOSIS — Z86010 Personal history of colonic polyps: Secondary | ICD-10-CM

## 2013-05-25 MED ORDER — SODIUM CHLORIDE 0.9 % IV SOLN: 500.0000 mL | INTRAVENOUS | Status: DC

## 2013-05-25 MED ORDER — LINACLOTIDE 145 MCG PO CAPS: 145.0000 ug | ORAL_CAPSULE | Freq: Every day | ORAL | Status: DC

## 2013-05-25 NOTE — Progress Notes (Signed)
Patient did not have preoperative order for IV antibiotic SSI prophylaxis. (G8918)  Patient did not experience any of the following events: a burn prior to discharge; a fall within the facility; wrong site/side/patient/procedure/implant event; or a hospital transfer or hospital admission upon discharge from the facility. (G8907)  

## 2013-05-25 NOTE — Patient Instructions (Addendum)

## 2013-05-25 NOTE — Progress Notes (Signed)
Called to room to assist during endoscopic procedure.  Patient ID and intended procedure confirmed with present staff. Received instructions for my participation in the procedure from the performing physician.  

## 2013-05-25 NOTE — Op Note (Signed)
[facility] [address] [place], [address]   COLONOSCOPY PROCEDURE REPORT  PATIENT: Diana Farley, Diana Farley  MR#: [id number] BIRTHDATE: [birth year] , 67  yrs. old GENDER: Female ENDOSCOPIST: Rachael Fee, MD PROCEDURE DATE:  05/25/2013 PROCEDURE:   Colonoscopy with snare polypectomy ASA CLASS:   Class II INDICATIONS:5 adenomas removed 2010, one with HGD; repeat colonoscopy 1 year later found 2 small adenomas. MEDICATIONS: Propofol (Diprivan) 420 mg IV and MAC sedation, administered by CRNA  DESCRIPTION OF PROCEDURE:   After the risks benefits and alternatives of the procedure were thoroughly explained, informed consent was obtained.  A digital rectal exam revealed no abnormalities of the rectum.   The LB [id number]  endoscope was introduced through the anus and advanced to the cecum, which was identified by both the appendix and ileocecal valve. No adverse events experienced.   The quality of the prep was good.  The instrument was then slowly withdrawn as the colon was fully examined.   COLON FINDINGS: Seven sessile polyps were found, removed and all were sent to pathology.  These ranged in size from 3mm to 10mm; located in ascending, transverse and descending segments.  They were all removed with cold snare.  The examination was otherwise normal.  Retroflexed views revealed no abnormalities. The time to cecum=4 minutes 20 seconds.  Withdrawal time=16 minutes 25 seconds. The scope was withdrawn and the procedure completed. COMPLICATIONS: There were no complications.  ENDOSCOPIC IMPRESSION: Seven sessile polyps were found, removed and all were sent to pathology. The examination was otherwise normal.  RECOMMENDATIONS: If the polyp(s) removed today are proven to be adenomatous (pre-cancerous) polyps, you will need a colonoscopy in 3 years. You will receive a letter within 1-2 weeks with the results of your biopsy as well as final recommendations.   Please call my office if you have not received a letter after 3 weeks. New prescription was called in for linzess trial (for your constipation).  Please take one pill once daily and call my office in 3-4 weeks to report on your response.   eSigned:  Rachael Fee, MD 05/25/2013 3:08 PM   cc: Christena Flake, MD

## 2013-05-25 NOTE — Progress Notes (Signed)
Procedure ends, to recovery awake, report given and VSS. 

## 2013-05-28 ENCOUNTER — Telehealth: Payer: Self-pay | Admitting: *Deleted

## 2013-05-28 NOTE — Telephone Encounter (Signed)
  Follow up Call-  Call back number 05/25/2013  Post procedure Call Back phone  # [phone]  Permission to leave phone message Yes     No answer,left message.

## 2013-05-30 ENCOUNTER — Encounter: Payer: Self-pay | Admitting: Gastroenterology

## 2013-06-06 ENCOUNTER — Telehealth: Payer: Self-pay | Admitting: Gastroenterology

## 2013-06-06 NOTE — Telephone Encounter (Signed)
Per pt she has been taking the linzess to help with the constipation. She says she was supposed to call and update on how she is doing. Says she doesnt have enough samples so she doesnt know or think that it is working as it should. She doesnt feel like she is emptying her bowels, but has had some relief.

## 2013-06-06 NOTE — Telephone Encounter (Signed)
Pt is much better but still does not feel like she has enough bowel movements.  She has only had 2 bowel movements in 8 days.  The stools were not hard but she felt as if she could have went more.  Please advise

## 2013-06-07 MED ORDER — LINACLOTIDE 290 MCG PO CAPS: 1.0000 | ORAL_CAPSULE | Freq: Once | ORAL | Status: DC

## 2013-06-07 NOTE — Telephone Encounter (Signed)
Left message on machine to call back  

## 2013-06-07 NOTE — Telephone Encounter (Signed)
Pt aware and med sent to the pharmacy 

## 2013-06-07 NOTE — Telephone Encounter (Signed)
OK.  Please change her prescription to the higher dose linzess pills.  She should take one pill once daily.  Disp 1 month, 3 refills.  rov with me in 4 weeks to see how she has responded.

## 2013-07-16 ENCOUNTER — Ambulatory Visit (INDEPENDENT_AMBULATORY_CARE_PROVIDER_SITE_OTHER): Payer: Medicare Other | Admitting: Gastroenterology

## 2013-07-16 ENCOUNTER — Encounter: Payer: Self-pay | Admitting: Gastroenterology

## 2013-07-16 VITALS — BP 100/48 | HR 62 | Ht 65.0 in | Wt 144.0 lb

## 2013-07-16 DIAGNOSIS — K5900 Constipation, unspecified: Secondary | ICD-10-CM

## 2013-07-16 NOTE — Patient Instructions (Addendum)
Continue on linzess, one pill daily.  It seems to be really helping. You need repeat colonoscopy in 3 years for polyp surveillance. You can adjust your miralax dose as needed.   Consider taking citrucel (orange flavored) powder fiber supplement.  This may cause some bloating at first but that usually goes away. Begin with a small spoonful and work your way up to a large, heaping spoonful daily over a week.                                                We are excited to introduce MyChart, a new best-in-class service that provides you online access to important information in your electronic medical record. We want to make it easier for you to view your health information - all in one secure location - when and where you need it. We expect MyChart will enhance the quality of care and service we provide.  When you register for MyChart, you can:    View your test results.    Request appointments and receive appointment reminders via email.    Request medication renewals.    View your medical history, allergies, medications and immunizations.    Communicate with your physician's office through a password-protected site.    Conveniently print information such as your medication lists.  To find out if MyChart is right for you, please talk to a member of our clinical staff today. We will gladly answer your questions about this free health and wellness tool.  If you are age 66 or older and want a member of your family to have access to your record, you must provide written consent by completing a proxy form available at our office. Please speak to our clinical staff about guidelines regarding accounts for patients younger than age 58.  As you activate your MyChart account and need any technical assistance, please call the MyChart technical support line at [phone]) or email your question to [email or web address]. If you email your question(s), please include your name, a  return phone number and the best time to reach you.  If you have non-urgent health-related questions, you can send a message to our office through MyChart at [email or web address]. If you have a medical emergency, call 911.  Thank you for using MyChart as your new health and wellness resource!   MyChart licensed from [employer],  4540-9811. Patents Pending.  High-Fiber Diet Fiber is found in fruits, vegetables, and grains. A high-fiber diet encourages the addition of more whole grains, legumes, fruits, and vegetables in your diet. The recommended amount of fiber for adult males is 38 g per day. For adult females, it is 25 g per day. Pregnant and lactating women should get 28 g of fiber per day. If you have a digestive or bowel problem, ask your caregiver for advice before adding high-fiber foods to your diet. Eat a variety of high-fiber foods instead of only a select few type of foods.  PURPOSE  To increase stool bulk.  To make bowel movements more regular to prevent constipation.  To lower cholesterol.  To prevent overeating. WHEN IS THIS DIET USED?  It may be used if you have constipation and hemorrhoids.  It may be used if you have uncomplicated diverticulosis (intestine condition) and irritable bowel syndrome.  It may be used if you need help with weight management.  It may be used if you want to add it to your diet as a protective measure against atherosclerosis, diabetes, and cancer. SOURCES OF FIBER  Whole-grain breads and cereals.  Fruits, such as apples, oranges, bananas, berries, prunes, and pears.  Vegetables, such as green peas, carrots, sweet potatoes, beets, broccoli, cabbage, spinach, and artichokes.  Legumes, such split peas, soy, lentils.  Almonds. FIBER CONTENT IN FOODS Starches and Grains / Dietary Fiber (g)  Cheerios, 1 cup / 3 g  Corn Flakes cereal, 1 cup / 0.7 g  Rice crispy treat cereal, 1 cup / 0.3 g  Instant oatmeal (cooked),   cup / 2 g  Frosted wheat cereal, 1 cup / 5.1 g  Brown, long-grain rice (cooked), 1 cup / 3.5 g  White, long-grain rice (cooked), 1 cup / 0.6 g  Enriched macaroni (cooked), 1 cup / 2.5 g Legumes / Dietary Fiber (g)  Baked beans (canned, plain, or vegetarian),  cup / 5.2 g  Kidney beans (canned),  cup / 6.8 g  Pinto beans (cooked),  cup / 5.5 g Breads and Crackers / Dietary Fiber (g)  Plain or honey graham crackers, 2 squares / 0.7 g  Saltine crackers, 3 squares / 0.3 g  Plain, salted pretzels, 10 pieces / 1.8 g  Whole-wheat bread, 1 slice / 1.9 g  White bread, 1 slice / 0.7 g  Raisin bread, 1 slice / 1.2 g  Plain bagel, 3 oz / 2 g  Flour tortilla, 1 oz / 0.9 g  Corn tortilla, 1 small / 1.5 g  Hamburger or hotdog bun, 1 small / 0.9 g Fruits / Dietary Fiber (g)  Apple with skin, 1 medium / 4.4 g  Sweetened applesauce,  cup / 1.5 g  Banana,  medium / 1.5 g  Grapes, 10 grapes / 0.4 g  Orange, 1 small / 2.3 g  Raisin, 1.5 oz / 1.6 g  Melon, 1 cup / 1.4 g Vegetables / Dietary Fiber (g)  Green beans (canned),  cup / 1.3 g  Carrots (cooked),  cup / 2.3 g  Broccoli (cooked),  cup / 2.8 g  Peas (cooked),  cup / 4.4 g  Mashed potatoes,  cup / 1.6 g  Lettuce, 1 cup / 0.5 g  Corn (canned),  cup / 1.6 g  Tomato,  cup / 1.1 g Document Released: 12/06/2005 Document Revised: 06/06/2012 Document Reviewed: 03/09/2012 [facility] Patient Information 2014 [place], [place].

## 2013-07-16 NOTE — Progress Notes (Signed)
Review of pertinent gastrointestinal problems: 1. Adenomatous colon polyps: 5 adenomas removed 2010, one with HGD; repeat colonoscopy 1 year later found 2 small adenomas.  Colonoscopy 05/2013 Christella Hartigan found seven small polyps all of which were small adenomas; recall colonoscopy recommended in 3 years. 2. Constipation: linzess trial 05/2013  HPI: This is a   very pleasant 67 year old woman who is here with her grand daughter today. This is the first time seeing her in the office.   She is "so much better" on high dose linzess.  One pill once daily.  She used to have a BM about once per week, twice at most.  On linzess she will have 3-4 per week. She is very happy with these results.    She will occasionally do a "clense." periodically. Drinks an herbal supplement, lemon, pepper concoction.  This sounds very safe from the way she describes.    Review of systems: Pertinent positive and negative review of systems were noted in the above HPI section. Complete review of systems was performed and was otherwise normal.    Past Medical History  Diagnosis Date  . History of supraventricular tachycardia 1999  . Atypical chest pain Aug. 2006    normal adequate exercise stress Moview; EF 74%, normal 2D echo w/ no significant valvular abnormalities  . Dyslipidemia     hypertriglyceridemia/low HDL, on Lipitor.  . Migraine headache     followed by Dr. Porfirio Mylar Dohmeier in [place]  . Exertional dyspnea   . Shortness of breath   . Chest pain   . Other dysphagia   . Personal history of colonic polyps   . Arthritis   . Asthma   . Chronic headaches   . Fibromyalgia   . Kidney stones   . Peptic ulcer disease   . Pneumonia   . UTI (urinary tract infection)   . Anxiety   . Allergy   . Anemia     after first childbirth- 1978, no problems since  . Other and unspecified hyperlipidemia     hx of  . Osteoporosis     wrist  . Hypothyroid     Past Surgical History  Procedure Laterality Date  . Cesarean  section      Two  . Left thumb surgery    . Cataract extraction      both eyes  . Tubal ligation    . Dental implants      Current Outpatient Prescriptions  Medication Sig Dispense Refill  . clonazePAM (KLONOPIN) 0.5 MG tablet Take 0.5 mg by mouth at bedtime.        . fexofenadine (ALLEGRA) 180 MG tablet Take 180 mg by mouth daily as needed.        Marland Kitchen HYDROcodone-acetaminophen (LORCET) 10-650 MG per tablet Take 1 tablet by mouth Every 6 hours as needed.      Marland Kitchen levothyroxine (SYNTHROID, LEVOTHROID) 112 MCG tablet Take 112 mcg by mouth daily.        . Linaclotide (LINZESS) 290 MCG CAPS Take 1 capsule by mouth once.  90 capsule  3  . metoprolol (TOPROL-XL) 25 MG 24 hr tablet Take 25 mg by mouth daily. Place on file for patient.       . polyethylene glycol (MIRALAX / GLYCOLAX) packet Take 17 g by mouth daily.      . sertraline (ZOLOFT) 100 MG tablet Take 100 mg by mouth daily.      . traZODone (DESYREL) 50 MG tablet Take 150 mg by mouth at bedtime.  Take 3 by mouth daily        No current facility-administered medications for this visit.    Allergies as of 07/16/2013  . (No Known Allergies)    Family History  Problem Relation Age of Onset  . Colon polyps Maternal Aunt   . Ovarian cancer    . Colon cancer Paternal Uncle   . Esophageal cancer Neg Hx   . Rectal cancer Neg Hx   . Stomach cancer Neg Hx   . Colon cancer Cousin     History   Social History  . Marital Status: Widowed    Spouse Name: N/A    Number of Children: N/A  . Years of Education: N/A   Occupational History  . Not on file.   Social History Main Topics  . Smoking status: Former Smoker -- 1.50 packs/day for 17 years    Types: Cigarettes    Quit date: 05/20/1981  . Smokeless tobacco: Never Used  . Alcohol Use: No  . Drug Use: No  . Sexually Active: Not on file   Other Topics Concern  . Not on file   Social History Narrative  . No narrative on file       Physical Exam: BP 100/48  Pulse 62   Ht 5\' 5"  (1.651 m)  Wt 144 lb (65.318 kg)  BMI 23.96 kg/m2 Constitutional: generally well-appearing Psychiatric: alert and oriented x3 Eyes: extraocular movements intact Mouth: oral pharynx moist, no lesions Neck: supple no lymphadenopathy Cardiovascular: heart regular rate and rhythm Lungs: clear to auscultation bilaterally Abdomen: soft, nontender, nondistended, no obvious ascites, no peritoneal signs, normal bowel sounds Extremities: no lower extremity edema bilaterally Skin: no lesions on visible extremities    Assessment and plan: 67 y.o. female with  chronic constipation  Her symptoms have clearly improved on high-dose linzess and she'll stay on for now. I've also given her options of bulking up her stool with Citrucel fiber supplement. She has had multiple adenomatous polyps removed and will be due for recall colonoscopy in about 3 years from now.

## 2013-07-26 ENCOUNTER — Other Ambulatory Visit: Payer: Self-pay | Admitting: Obstetrics and Gynecology

## 2013-07-26 ENCOUNTER — Other Ambulatory Visit: Payer: Self-pay

## 2013-07-26 DIAGNOSIS — E039 Hypothyroidism, unspecified: Secondary | ICD-10-CM

## 2013-07-26 DIAGNOSIS — M949 Disorder of cartilage, unspecified: Secondary | ICD-10-CM

## 2013-07-26 DIAGNOSIS — M899 Disorder of bone, unspecified: Secondary | ICD-10-CM

## 2013-07-26 DIAGNOSIS — Z1231 Encounter for screening mammogram for malignant neoplasm of breast: Secondary | ICD-10-CM

## 2013-08-14 ENCOUNTER — Ambulatory Visit
Admission: RE | Admit: 2013-08-14 | Discharge: 2013-08-14 | Disposition: A | Discharge status: Home / Self Care | Payer: Medicare Other | Source: Ambulatory Visit | Attending: Obstetrics and Gynecology | Admitting: Obstetrics and Gynecology

## 2013-08-14 DIAGNOSIS — E039 Hypothyroidism, unspecified: Secondary | ICD-10-CM

## 2013-08-14 DIAGNOSIS — Z1231 Encounter for screening mammogram for malignant neoplasm of breast: Secondary | ICD-10-CM

## 2013-08-14 DIAGNOSIS — M899 Disorder of bone, unspecified: Secondary | ICD-10-CM

## 2014-07-31 ENCOUNTER — Other Ambulatory Visit: Payer: Self-pay

## 2014-07-31 DIAGNOSIS — Z1231 Encounter for screening mammogram for malignant neoplasm of breast: Secondary | ICD-10-CM

## 2014-08-15 ENCOUNTER — Other Ambulatory Visit: Payer: Self-pay

## 2014-08-15 ENCOUNTER — Ambulatory Visit
Admission: RE | Admit: 2014-08-15 | Discharge: 2014-08-15 | Disposition: A | Discharge status: Home / Self Care | Payer: Medicare Other | Source: Ambulatory Visit

## 2014-08-15 DIAGNOSIS — Z1231 Encounter for screening mammogram for malignant neoplasm of breast: Secondary | ICD-10-CM

## 2015-09-15 ENCOUNTER — Other Ambulatory Visit: Payer: Self-pay

## 2015-09-15 DIAGNOSIS — Z1231 Encounter for screening mammogram for malignant neoplasm of breast: Secondary | ICD-10-CM

## 2015-10-15 ENCOUNTER — Ambulatory Visit: Payer: Medicare Other

## 2015-11-18 ENCOUNTER — Ambulatory Visit
Admission: RE | Admit: 2015-11-18 | Discharge: 2015-11-18 | Disposition: A | Discharge status: Home / Self Care | Payer: Medicare Other | Source: Ambulatory Visit

## 2015-11-18 DIAGNOSIS — Z1231 Encounter for screening mammogram for malignant neoplasm of breast: Secondary | ICD-10-CM

## 2016-06-03 ENCOUNTER — Encounter: Payer: Self-pay | Admitting: Gastroenterology

## 2016-06-08 ENCOUNTER — Encounter: Payer: Self-pay | Admitting: Gastroenterology

## 2016-07-14 ENCOUNTER — Encounter: Payer: Self-pay | Admitting: *Deleted

## 2016-07-21 ENCOUNTER — Encounter (INDEPENDENT_AMBULATORY_CARE_PROVIDER_SITE_OTHER): Payer: Self-pay

## 2016-07-21 ENCOUNTER — Ambulatory Visit (AMBULATORY_SURGERY_CENTER): Payer: Self-pay | Admitting: *Deleted

## 2016-07-21 VITALS — Ht 65.0 in | Wt 140.8 lb

## 2016-07-21 DIAGNOSIS — K279 Peptic ulcer, site unspecified, unspecified as acute or chronic, without hemorrhage or perforation: Secondary | ICD-10-CM | POA: Insufficient documentation

## 2016-07-21 DIAGNOSIS — Z86010 Personal history of colonic polyps: Secondary | ICD-10-CM

## 2016-07-21 MED ORDER — NA SULFATE-K SULFATE-MG SULF 17.5-3.13-1.6 GM/177ML PO SOLN: 1.0000 | Freq: Once | ORAL | 0 refills | Status: AC

## 2016-07-21 NOTE — Progress Notes (Signed)
No egg or soy allergy known to patient  No issues with past sedation with any surgeries  or procedures, no intubation problems  No diet pills per patient No home 02 use per patient  No blood thinners per patient  Pt issues with constipation  Is chronic- takes senna as needed- but no daily use lately - off linzess

## 2016-07-30 ENCOUNTER — Other Ambulatory Visit: Payer: Self-pay | Admitting: Obstetrics and Gynecology

## 2016-07-30 DIAGNOSIS — Z1231 Encounter for screening mammogram for malignant neoplasm of breast: Secondary | ICD-10-CM

## 2016-08-04 ENCOUNTER — Ambulatory Visit (AMBULATORY_SURGERY_CENTER): Payer: Medicare Other | Admitting: Gastroenterology

## 2016-08-04 ENCOUNTER — Telehealth: Payer: Self-pay

## 2016-08-04 ENCOUNTER — Encounter: Payer: Self-pay | Admitting: Gastroenterology

## 2016-08-04 VITALS — BP 110/63 | HR 63 | Temp 98.0°F | Resp 12 | Ht 65.0 in | Wt 140.0 lb

## 2016-08-04 DIAGNOSIS — Z86010 Personal history of colonic polyps: Secondary | ICD-10-CM | POA: Diagnosis present

## 2016-08-04 DIAGNOSIS — D123 Benign neoplasm of transverse colon: Secondary | ICD-10-CM | POA: Diagnosis not present

## 2016-08-04 DIAGNOSIS — D126 Benign neoplasm of colon, unspecified: Secondary | ICD-10-CM

## 2016-08-04 DIAGNOSIS — D124 Benign neoplasm of descending colon: Secondary | ICD-10-CM

## 2016-08-04 HISTORY — PX: COLONOSCOPY: SHX174

## 2016-08-04 MED ORDER — SODIUM CHLORIDE 0.9 % IV SOLN: 500.0000 mL | INTRAVENOUS | Status: DC

## 2016-08-04 NOTE — Telephone Encounter (Signed)
Dr. [name]' office will arrange referral to genetic                            councilor at the cancer center. 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

## 2016-08-04 NOTE — Patient Instructions (Signed)
  DR. Eugenia Pancoast OFFICE WILL ARRANGE A REFERRAL TO GENETICS.   YOU HAD AN ENDOSCOPIC PROCEDURE TODAY AT [facility]:   Refer to the procedure report that was given to you for any specific questions about what was found during the examination.  If the procedure report does not answer your questions, please call your gastroenterologist to clarify.  If you requested that your care partner not be given the details of your procedure findings, then the procedure report has been included in a sealed envelope for you to review at your convenience later.  YOU SHOULD EXPECT: Some feelings of bloating in the abdomen. Passage of more gas than usual.  Walking can help get rid of the air that was put into your GI tract during the procedure and reduce the bloating. If you had a lower endoscopy (such as a colonoscopy or flexible sigmoidoscopy) you may notice spotting of blood in your stool or on the toilet paper. If you underwent a bowel prep for your procedure, you may not have a normal bowel movement for a few days.  Please Note:  You might notice some irritation and congestion in your nose or some drainage.  This is from the oxygen used during your procedure.  There is no need for concern and it should clear up in a day or so.  SYMPTOMS TO REPORT IMMEDIATELY:   Following lower endoscopy (colonoscopy or flexible sigmoidoscopy):  Excessive amounts of blood in the stool  Significant tenderness or worsening of abdominal pains  Swelling of the abdomen that is new, acute  Fever of 100F or higher   For urgent or emergent issues, a gastroenterologist can be reached at any hour by calling [phone].   DIET:  We do recommend a small meal at first, but then you may proceed to your regular diet.  Drink plenty of fluids but you should avoid alcoholic beverages for 24 hours.  ACTIVITY:  You should plan to take it easy for the rest of today and you should NOT DRIVE or use heavy machinery until  tomorrow (because of the sedation medicines used during the test).    FOLLOW UP: Our staff will call the number listed on your records the next business day following your procedure to check on you and address any questions or concerns that you may have regarding the information given to you following your procedure. If we do not reach you, we will leave a message.  However, if you are feeling well and you are not experiencing any problems, there is no need to return our call.  We will assume that you have returned to your regular daily activities without incident.  If any biopsies were taken you will be contacted by phone or by letter within the next 1-3 weeks.  Please call us at [phone] if you have not heard about the biopsies in 3 weeks.    SIGNATURES/CONFIDENTIALITY: You and/or your care partner have signed paperwork which will be entered into your electronic medical record.  These signatures attest to the fact that that the information above on your After Visit Summary has been reviewed and is understood.  Full responsibility of the confidentiality of this discharge information lies with you and/or your care-partner.

## 2016-08-04 NOTE — Telephone Encounter (Signed)
The referral has been made and genetics will contact the pt with the appt.

## 2016-08-04 NOTE — Progress Notes (Signed)
To recovery, report to Tyrell, RN, VSS. 

## 2016-08-04 NOTE — Op Note (Signed)
[facility] Patient Name: Diana Farley Procedure Date: 08/04/2016 8:22 AM MRN: [id number] Endoscopist: Milus Banister , MD Age: 70 Referring MD:  Date of Birth: [birth year] Gender: Female Account #: [id number] Procedure:                Colonoscopy Indications:              High risk colon cancer surveillance: Personal                            history of colonic polyps Adenomatous colon                            polyps:5 adenomas removed 2010, one with HGD;                            repeat colonoscopy 1 year later found 2 small                            adenomas. Colonoscopy 05/2013 Diana Farley found seven                            small polyps all of which were small adenomas;                            recall colonoscopy recommended in 3 years. Medicines:                Monitored Anesthesia Care Procedure:                Pre-Anesthesia Assessment:                           - Prior to the procedure, a History and Physical                            was performed, and patient medications and                            allergies were reviewed. The patient's tolerance of                            previous anesthesia was also reviewed. The risks                            and benefits of the procedure and the sedation                            options and risks were discussed with the patient.                            All questions were answered, and informed consent                            was obtained. Prior Anticoagulants: The patient has  taken no previous anticoagulant or antiplatelet                            agents. ASA Grade Assessment: II - A patient with                            mild systemic disease. After reviewing the risks                            and benefits, the patient was deemed in                            satisfactory condition to undergo the procedure.                           After obtaining informed consent, the  colonoscope                            was passed under direct vision. Throughout the                            procedure, the patient's blood pressure, pulse, and                            oxygen saturations were monitored continuously. The                            Model CF-HQ190L [id number]) scope was introduced                            through the anus and advanced to the the cecum,                            identified by appendiceal orifice and ileocecal                            valve. The colonoscopy was performed without                            difficulty. The patient tolerated the procedure                            well. The quality of the bowel preparation was                            good. The ileocecal valve, appendiceal orifice, and                            rectum were photographed. Scope In: 8:39:17 AM Scope Out: 8:58:11 AM Scope Withdrawal Time: 0 hours 13 minutes 52 seconds  Total Procedure Duration: 0 hours 18 minutes 54 seconds  Findings:                 A 7 mm polyp was found in the transverse colon. The  polyp was sessile. The polyp was removed with a hot                            snare. Resection and retrieval were complete.                           Four sessile polyps were found in the descending                            colon and transverse colon. The polyps were 4 to 5                            mm in size. These polyps were removed with a cold                            snare. Resection and retrieval were complete.                           The exam was otherwise without abnormality on                            direct and retroflexion views. Complications:            No immediate complications. Estimated blood loss:                            None. Estimated Blood Loss:     Estimated blood loss: none. Impression:               - One 7 mm polyp in the transverse colon, removed                            with a hot  snare. Resected and retrieved.                           - Four 4 to 5 mm polyps in the descending colon and                            in the transverse colon, removed with a cold snare.                            Resected and retrieved.                           - The examination was otherwise normal on direct                            and retroflexion views. Recommendation:           - Patient has a contact number available for                            emergencies. The signs and symptoms of potential  delayed complications were discussed with the                            patient. Return to normal activities tomorrow.                            Written discharge instructions were provided to the                            patient.                           - Resume previous diet.                           - Continue present medications.                           You will receive a letter within 2-3 weeks with the                            pathology results and my final recommendations.                           If the polyp(s) is proven to be 'pre-cancerous' on                            pathology, you will need repeat colonoscopy in 3                            years.                           Dr. Ardis Farley' office will arrange referral to genetic                            councilor at the cancer center. ?attenuated                            polyposis syndrome (lifetime history of 15-20                            adenomatous polyps now) Milus Banister, MD 08/04/2016 9:02:53 AM This report has been signed electronically.

## 2016-08-05 ENCOUNTER — Telehealth: Payer: Self-pay

## 2016-08-05 NOTE — Telephone Encounter (Signed)
Left message on machine.

## 2016-08-10 ENCOUNTER — Telehealth: Payer: Self-pay | Admitting: Genetic Counselor

## 2016-08-10 ENCOUNTER — Encounter: Payer: Self-pay | Admitting: Genetic Counselor

## 2016-08-10 NOTE — Telephone Encounter (Signed)
Pt confirmed appt, completed intake, and reviewed how to set up my chart for the questionnaire.

## 2016-08-11 ENCOUNTER — Other Ambulatory Visit: Payer: Medicare Other

## 2016-08-11 ENCOUNTER — Encounter: Payer: Self-pay | Admitting: Genetic Counselor

## 2016-08-11 ENCOUNTER — Ambulatory Visit (HOSPITAL_BASED_OUTPATIENT_CLINIC_OR_DEPARTMENT_OTHER): Payer: Medicare Other | Admitting: Genetic Counselor

## 2016-08-11 DIAGNOSIS — Z800 Family history of malignant neoplasm of digestive organs: Secondary | ICD-10-CM | POA: Diagnosis not present

## 2016-08-11 DIAGNOSIS — Z85819 Personal history of malignant neoplasm of unspecified site of lip, oral cavity, and pharynx: Secondary | ICD-10-CM | POA: Diagnosis not present

## 2016-08-11 DIAGNOSIS — Z86010 Personal history of colonic polyps: Secondary | ICD-10-CM | POA: Diagnosis present

## 2016-08-11 DIAGNOSIS — Z8042 Family history of malignant neoplasm of prostate: Secondary | ICD-10-CM

## 2016-08-11 DIAGNOSIS — Z8041 Family history of malignant neoplasm of ovary: Secondary | ICD-10-CM

## 2016-08-11 NOTE — Progress Notes (Signed)
REFERRING PROVIDER: Olena Mater, MD [address] [place] [address]   Owens Loffler, MD  PRIMARY PROVIDER:  Olena Mater, MD  PRIMARY REASON FOR VISIT:  1. PERSONAL HX COLONIC POLYPS   2. Family history of ovarian cancer   3. Family history of colon cancer   4. Family history of prostate cancer   5. Family history of oral cancer      HISTORY OF PRESENT ILLNESS:   Diana Farley, a 70 y.o. female, was seen for a [facility] cancer genetics consultation at the request of Dr. [name] due to a personal history of colon polyps family history of cancer.  Diana Farley presents to clinic today to discuss the possibility of a hereditary predisposition to cancer, genetic testing, and to further clarify her future cancer risks, as well as potential cancer risks for family members. Diana Farley is a 70 y.o. female with no personal history of cancer.  At the age of 68 she started having colonoscopies and colon polyps were found.  She was asked to come back in 10 years.  At that time she was seen by Dr. [name], and multiple polyps were found, one with HGD.  Since that time she has started being seen with shorter intervals between colonoscopies.  Dr. [name] is concerned for a polyposis syndrome.  CANCER HISTORY:   No history exists.     HORMONAL RISK FACTORS:  Menarche was at age 36.  First live birth at age 33.  OCP use for approximately 10 years.  Ovaries intact: yes.  Hysterectomy: no.  Menopausal status: postmenopausal.  HRT use: for several years years. Colonoscopy: yes; 20+ lifetime colon polyps. Mammogram within the last year: yes. Number of breast biopsies: 0. Up to date with pelvic exams:  yes. Any excessive radiation exposure in the past:  no  Past Medical History:  Diagnosis Date  . Allergy   . Anemia    after first childbirth- 1978, no problems since  . Anxiety   . Arthritis   . Asthma   . Atypical chest pain Aug. 2006   normal adequate exercise stress Moview; EF 74%,  normal 2D echo w/ no significant valvular abnormalities  . Cataract    bilateral  . Chest pain   . Chronic headaches   . Dyslipidemia    hypertriglyceridemia/low HDL, on Lipitor.  . Exertional dyspnea   . Family history of colon cancer   . Family history of oral cancer   . Family history of ovarian cancer   . Family history of prostate cancer   . Fibromyalgia   . History of supraventricular tachycardia 1999  . Hypothyroid   . Kidney stones   . Migraine headache    followed by Dr. Asencion Partridge Dohmeier in [place]  . Osteoporosis    wrist  . Other and unspecified hyperlipidemia    hx of  . Other dysphagia   . Peptic ulcer disease    pt denies  . Personal history of colonic polyps   . Pneumonia   . Shortness of breath   . UTI (urinary tract infection)     Past Surgical History:  Procedure Laterality Date  . CATARACT EXTRACTION     both eyes  . CESAREAN SECTION     Two  . COLONOSCOPY    . dental implants    . Left thumb surgery    . POLYPECTOMY    . TUBAL LIGATION      Social History   Social History  . Marital status: Widowed  Spouse name: N/A  . Number of children: 2  . Years of education: N/A   Social History Main Topics  . Smoking status: Former Smoker    Packs/day: 1.50    Years: 17.00    Types: Cigarettes    Quit date: 05/20/1981  . Smokeless tobacco: Never Used  . Alcohol use Yes     Comment: occ glass of wine with dinner  . Drug use: No  . Sexual activity: Not Asked   Other Topics Concern  . None   Social History Narrative  . None     FAMILY HISTORY:  We obtained a detailed, 4-generation family history.  Significant diagnoses are listed below: Family History  Problem Relation Age of Onset  . Colon polyps Maternal Aunt   . Colon cancer Paternal Uncle     dx 2 x, first time in his 72s  . Colon cancer Cousin     maternal first cousin  . Stomach cancer Maternal Uncle     dx in his 2s-70  . Prostate cancer Maternal Uncle     dx in his 12s-70s   . Ovarian cancer Maternal Aunt 70  . Ovarian cancer Paternal Aunt 52  . Prostate cancer Cousin     maternal first cousin  . Head & neck cancer Cousin     maternal first cousin  . Esophageal cancer Neg Hx   . Rectal cancer Neg Hx     The patient has two children who are cancer free.  She has a brother and a sister who are cancer free.  Her mother died at 49 from a fall down the stairs.  She had two brothers and two sisters.  One sister had ovarian cancer and a brother had both prostate and stomach cancer.  This brother had two sons, one with prostate cancer and another with colon cancer.  The sister with ovarian cancer had a daughter with multiple oral cancers.  The patient's father died at 61 from complications of rheumatic fever when he was 30.  He had three sisters and a brother. His brother developed colon cancer in his 26's and had it twice.  A sister had ovarian cancer. There is no other reported cancer history.  Patient's maternal ancestors are of [place], [place] and [place] descent, and paternal ancestors are of Caucasian descent. There is no reported Ashkenazi Jewish ancestry. There is no known consanguinity.  GENETIC COUNSELING ASSESSMENT: Diana Farley is a 70 y.o. female with a personal history of colon polyps and family history of cancer which is somewhat suggestive of a hereditary cancer syndrome and predisposition to cancer. We, therefore, discussed and recommended the following at today's visit.   DISCUSSION: We discussed that about 5% of colon cancer is hereditary, with most cases due to Lynch syndrome.  While the concern based on her colon polyps is for a polyposis syndrome, my concern based on her polyps and family history is for Lynch syndrome.  We discussed that Lynch syndrome is the most common form of hereditary colon cancer syndromes.  While polyps are common, the number of polyps the patient has is uncommon.  Therefore, we should also look at genes associated with polyposis  syndromes like FAP and MUTYH.  Additionally, there is ovarian cancer in the family.  The most common cause of ovarian cancer is due to BRCA mutations.  While the cancer in the family is not typical for BRCA, it is not unreasonable to test for this condition.  We reviewed the characteristics,  features and inheritance patterns of hereditary cancer syndromes. We also discussed genetic testing, including the appropriate family members to test, the process of testing, insurance coverage and turn-around-time for results. We discussed the implications of a negative, positive and/or variant of uncertain significant result. We recommended Diana Farley pursue genetic testing for the Common Hereditary gene panel. The Hereditary Gene Panel offered by [facility] includes sequencing and/or deletion duplication testing of the following 42 genes: APC, ATM, AXIN2, BARD1, BMPR1A, BRCA1, BRCA2, BRIP1, CDH1, CDKN2A, CHEK2, DICER1, EPCAM, GREM1, KIT, MEN1, MLH1, MSH2, MSH6, MUTYH, NBN, NF1, PALB2, PDGFRA, PMS2, POLD1, POLE, PTEN, RAD50, RAD51C, RAD51D, SDHA, SDHB, SDHC, SDHD, SMAD4, SMARCA4. STK11, TP53, TSC1, TSC2, and VHL.     Based on Diana Farley's personal history of colon polyps and family history of cancer, she meets medical criteria for genetic testing. Despite that she meets criteria, she may still have an out of pocket cost. We discussed that if her out of pocket cost for testing is over $100, the laboratory will call and confirm whether she wants to proceed with testing.  If the out of pocket cost of testing is less than $100 she will be billed by the genetic testing laboratory.   PLAN: After considering the risks, benefits, and limitations, Diana Farley  provided informed consent to pursue genetic testing and the blood sample was sent to [facility] for analysis of the Common Hereditary cancer panel. Results should be available within approximately 2-3 weeks' time, at which point they will be disclosed by telephone to Ms.  Farley, as will any additional recommendations warranted by these results. Diana Farley will receive a summary of her genetic counseling visit and a copy of her results once available. This information will also be available in Epic. We encouraged Diana Farley to remain in contact with cancer genetics annually so that we can continuously update the family history and inform her of any changes in cancer genetics and testing that may be of benefit for her family. Diana Farley questions were answered to her satisfaction today. Our contact information was provided should additional questions or concerns arise.  Lastly, we encouraged Diana Farley to remain in contact with cancer genetics annually so that we can continuously update the family history and inform her of any changes in cancer genetics and testing that may be of benefit for this family.   Ms.  Farley questions were answered to her satisfaction today. Our contact information was provided should additional questions or concerns arise. Thank you for the referral and allowing [place] to share in the care of your patient.   Concettina Leth P. Florene Glen, [place], [facility] Certified Genetic Counselor [email or web address] phone: [phone]  The patient was seen for a total of 55 minutes in face-to-face genetic counseling.  This patient was discussed with Drs. Magrinat, Lindi Adie and/or Burr Medico who agrees with the above.    _______________________________________________________________________ For Office Staff:  Number of people involved in session: 4 Was an Intern/ student involved with case: no

## 2016-08-13 ENCOUNTER — Encounter: Payer: Self-pay | Admitting: Gastroenterology

## 2016-08-19 ENCOUNTER — Encounter (HOSPITAL_BASED_OUTPATIENT_CLINIC_OR_DEPARTMENT_OTHER): Payer: Self-pay | Admitting: *Deleted

## 2016-08-20 ENCOUNTER — Other Ambulatory Visit: Payer: Self-pay | Admitting: Orthopedic Surgery

## 2016-08-25 NOTE — Anesthesia Preprocedure Evaluation (Addendum)
Anesthesia Evaluation  Patient identified by MRN, date of birth, ID band Patient awake    Reviewed: Allergy & Precautions, NPO status , Patient's Chart, lab work & pertinent test results  History of Anesthesia Complications Negative for: history of anesthetic complications  Airway Mallampati: III  TM Distance: >3 FB Neck ROM: Full    Dental  (+) Teeth Intact, Dental Advisory Given, Partial Upper   Pulmonary asthma , neg sleep apnea, neg COPD, neg recent URI, former smoker,     + wheezing (LLL)      Cardiovascular (-) hypertension(-) angina+ DOE  (-) Past MI and (-) Cardiac Stents + dysrhythmias (h/o SVT in 1999)  Rhythm:Regular Rate:Normal     Neuro/Psych  Headaches, neg Seizures PSYCHIATRIC DISORDERS Anxiety    GI/Hepatic Neg liver ROS, PUD, neg GERD  ,  Endo/Other  neg diabetesHypothyroidism   Renal/GU Renal disease (h/o nephrolithiasis)     Musculoskeletal  (+) Arthritis , Fibromyalgia -osteoporosis   Abdominal   Peds  Hematology  (+) Blood dyscrasia (after childbirth), anemia ,   Anesthesia Other Findings HLD  Reproductive/Obstetrics                            Anesthesia Physical Anesthesia Plan  ASA: III  Anesthesia Plan: General and Regional   Post-op Pain Management: GA combined w/ Regional for post-op pain   Induction: Intravenous  Airway Management Planned: LMA  Additional Equipment:   Intra-op Plan:   Post-operative Plan: Extubation in OR  Informed Consent: I have reviewed the patients History and Physical, chart, labs and discussed the procedure including the risks, benefits and alternatives for the proposed anesthesia with the patient or authorized representative who has indicated his/her understanding and acceptance.   Dental advisory given  Plan Discussed with:   Anesthesia Plan Comments: (Risks of general anesthesia discussed including, but not limited to,  sore throat, hoarse voice, chipped/damaged teeth, injury to vocal cords, nausea and vomiting, allergic reactions, lung infection, heart attack, stroke, and death. All questions answered.  Discussed potential risks of nerve blocks including, but not limited to, infection, bleeding, nerve damage, seizures, pneumothorax, respiratory depression, and potential failure of the block. Alternatives to nerve blocks discussed. All questions answered. )       Anesthesia Quick Evaluation

## 2016-08-26 ENCOUNTER — Ambulatory Visit (HOSPITAL_BASED_OUTPATIENT_CLINIC_OR_DEPARTMENT_OTHER): Payer: Medicare Other | Admitting: Anesthesiology

## 2016-08-26 ENCOUNTER — Encounter: Payer: Self-pay | Admitting: Genetic Counselor

## 2016-08-26 ENCOUNTER — Ambulatory Visit (HOSPITAL_BASED_OUTPATIENT_CLINIC_OR_DEPARTMENT_OTHER)
Admission: RE | Admit: 2016-08-26 | Discharge: 2016-08-26 | Disposition: A | Discharge status: Home / Self Care | Payer: Medicare Other | Source: Ambulatory Visit | Attending: Orthopedic Surgery | Admitting: Orthopedic Surgery

## 2016-08-26 ENCOUNTER — Encounter (HOSPITAL_BASED_OUTPATIENT_CLINIC_OR_DEPARTMENT_OTHER)
Admission: RE | Disposition: A | Discharge status: Home / Self Care | Payer: Self-pay | Source: Ambulatory Visit | Attending: Orthopedic Surgery

## 2016-08-26 ENCOUNTER — Encounter (HOSPITAL_BASED_OUTPATIENT_CLINIC_OR_DEPARTMENT_OTHER): Payer: Self-pay | Admitting: Certified Registered"

## 2016-08-26 ENCOUNTER — Telehealth: Payer: Self-pay | Admitting: Genetic Counselor

## 2016-08-26 DIAGNOSIS — M810 Age-related osteoporosis without current pathological fracture: Secondary | ICD-10-CM | POA: Insufficient documentation

## 2016-08-26 DIAGNOSIS — J45909 Unspecified asthma, uncomplicated: Secondary | ICD-10-CM | POA: Insufficient documentation

## 2016-08-26 DIAGNOSIS — E039 Hypothyroidism, unspecified: Secondary | ICD-10-CM | POA: Insufficient documentation

## 2016-08-26 DIAGNOSIS — E781 Pure hyperglyceridemia: Secondary | ICD-10-CM | POA: Insufficient documentation

## 2016-08-26 DIAGNOSIS — M797 Fibromyalgia: Secondary | ICD-10-CM | POA: Insufficient documentation

## 2016-08-26 DIAGNOSIS — M19072 Primary osteoarthritis, left ankle and foot: Secondary | ICD-10-CM | POA: Diagnosis present

## 2016-08-26 DIAGNOSIS — G8929 Other chronic pain: Secondary | ICD-10-CM | POA: Insufficient documentation

## 2016-08-26 DIAGNOSIS — F419 Anxiety disorder, unspecified: Secondary | ICD-10-CM | POA: Diagnosis not present

## 2016-08-26 DIAGNOSIS — Z87891 Personal history of nicotine dependence: Secondary | ICD-10-CM | POA: Diagnosis not present

## 2016-08-26 DIAGNOSIS — Z1379 Encounter for other screening for genetic and chromosomal anomalies: Secondary | ICD-10-CM | POA: Insufficient documentation

## 2016-08-26 HISTORY — PX: GASTROCNEMIUS RECESSION: SHX863

## 2016-08-26 HISTORY — PX: TARSAL METATARSAL ARTHRODESIS: SHX2481

## 2016-08-26 SURGERY — FUSION, TARSOMETATARSAL JOINT
Anesthesia: Regional | Site: Leg Lower | Laterality: Left

## 2016-08-26 MED ORDER — ONDANSETRON HCL 4 MG/2ML IJ SOLN
INTRAMUSCULAR | Status: DC | PRN
  Administered 2016-08-26: 4 mg via INTRAVENOUS

## 2016-08-26 MED ORDER — MIDAZOLAM HCL 2 MG/2ML IJ SOLN
INTRAMUSCULAR | Status: AC
  Filled 2016-08-26: qty 2

## 2016-08-26 MED ORDER — CEFAZOLIN SODIUM-DEXTROSE 2-4 GM/100ML-% IV SOLN
2.0000 g | INTRAVENOUS | Status: AC
  Administered 2016-08-26: 2 g via INTRAVENOUS

## 2016-08-26 MED ORDER — ASPIRIN EC 81 MG PO TBEC: 81.0000 mg | DELAYED_RELEASE_TABLET | Freq: Two times a day (BID) | ORAL | 0 refills | Status: DC

## 2016-08-26 MED ORDER — LIDOCAINE 2% (20 MG/ML) 5 ML SYRINGE
INTRAMUSCULAR | Status: DC | PRN
  Administered 2016-08-26: 30 mg via INTRAVENOUS

## 2016-08-26 MED ORDER — OXYCODONE HCL 5 MG PO TABS: 5.0000 mg | ORAL_TABLET | ORAL | 0 refills | Status: DC | PRN

## 2016-08-26 MED ORDER — FENTANYL CITRATE (PF) 100 MCG/2ML IJ SOLN
INTRAMUSCULAR | Status: AC
  Filled 2016-08-26: qty 2

## 2016-08-26 MED ORDER — SENNA 8.6 MG PO TABS: 2.0000 | ORAL_TABLET | Freq: Two times a day (BID) | ORAL | 0 refills | Status: AC

## 2016-08-26 MED ORDER — FENTANYL CITRATE (PF) 100 MCG/2ML IJ SOLN
25.0000 ug | INTRAMUSCULAR | Status: DC | PRN
  Administered 2016-08-26: 25 ug via INTRAVENOUS

## 2016-08-26 MED ORDER — SCOPOLAMINE 1 MG/3DAYS TD PT72: 1.0000 | MEDICATED_PATCH | Freq: Once | TRANSDERMAL | Status: DC | PRN

## 2016-08-26 MED ORDER — DEXAMETHASONE SODIUM PHOSPHATE 10 MG/ML IJ SOLN
INTRAMUSCULAR | Status: DC | PRN
  Administered 2016-08-26: 10 mg via INTRAVENOUS

## 2016-08-26 MED ORDER — GLYCOPYRROLATE 0.2 MG/ML IJ SOLN: 0.2000 mg | Freq: Once | INTRAMUSCULAR | Status: DC | PRN

## 2016-08-26 MED ORDER — FENTANYL CITRATE (PF) 100 MCG/2ML IJ SOLN
50.0000 ug | INTRAMUSCULAR | Status: DC | PRN
  Administered 2016-08-26: 50 ug via INTRAVENOUS

## 2016-08-26 MED ORDER — ALBUTEROL SULFATE HFA 108 (90 BASE) MCG/ACT IN AERS
2.0000 | INHALATION_SPRAY | Freq: Once | RESPIRATORY_TRACT | Status: AC
  Administered 2016-08-26: 2 via RESPIRATORY_TRACT

## 2016-08-26 MED ORDER — MIDAZOLAM HCL 2 MG/2ML IJ SOLN
1.0000 mg | INTRAMUSCULAR | Status: DC | PRN
  Administered 2016-08-26: 2 mg via INTRAVENOUS

## 2016-08-26 MED ORDER — METOPROLOL TARTRATE 25 MG PO TABS
ORAL_TABLET | ORAL | Status: AC
Start: 2016-08-26 — End: 2016-08-26
  Filled 2016-08-26: qty 1

## 2016-08-26 MED ORDER — CEFAZOLIN SODIUM-DEXTROSE 2-4 GM/100ML-% IV SOLN
INTRAVENOUS | Status: AC
  Filled 2016-08-26: qty 100

## 2016-08-26 MED ORDER — BUPIVACAINE-EPINEPHRINE (PF) 0.5% -1:200000 IJ SOLN
INTRAMUSCULAR | Status: DC | PRN
  Administered 2016-08-26: 30 mL via PERINEURAL
  Administered 2016-08-26: 20 mL via PERINEURAL

## 2016-08-26 MED ORDER — METOPROLOL SUCCINATE ER 25 MG PO TB24
25.0000 mg | ORAL_TABLET | Freq: Every day | ORAL | Status: DC
  Administered 2016-08-26: 25 mg via ORAL

## 2016-08-26 MED ORDER — ALBUTEROL SULFATE HFA 108 (90 BASE) MCG/ACT IN AERS
INHALATION_SPRAY | RESPIRATORY_TRACT | Status: AC
  Filled 2016-08-26: qty 6.7

## 2016-08-26 MED ORDER — PROPOFOL 10 MG/ML IV BOLUS
INTRAVENOUS | Status: DC | PRN
  Administered 2016-08-26: 150 mg via INTRAVENOUS

## 2016-08-26 MED ORDER — SODIUM CHLORIDE 0.9 % IV SOLN: INTRAVENOUS | Status: DC

## 2016-08-26 MED ORDER — PROMETHAZINE HCL 25 MG/ML IJ SOLN: 6.2500 mg | INTRAMUSCULAR | Status: DC | PRN

## 2016-08-26 MED ORDER — 0.9 % SODIUM CHLORIDE (POUR BTL) OPTIME
TOPICAL | Status: DC | PRN
  Administered 2016-08-26: 200 mL

## 2016-08-26 MED ORDER — DOCUSATE SODIUM 100 MG PO CAPS: 100.0000 mg | ORAL_CAPSULE | Freq: Two times a day (BID) | ORAL | 0 refills | Status: DC

## 2016-08-26 MED ORDER — LACTATED RINGERS IV SOLN
INTRAVENOUS | Status: DC
  Administered 2016-08-26 (×2): via INTRAVENOUS

## 2016-08-26 MED ORDER — CHLORHEXIDINE GLUCONATE 4 % EX LIQD: 60.0000 mL | Freq: Once | CUTANEOUS | Status: DC

## 2016-08-26 SURGICAL SUPPLY — 86 items
BANDAGE ESMARK 6X9 LF (GAUZE/BANDAGES/DRESSINGS) ×2 IMPLANT
BIT DRILL 2.4 AO COUPLING CANN (BIT) ×2 IMPLANT
BIT DRILL 2.5X2.75 QC CALB (BIT) ×3 IMPLANT
BLADE AVERAGE 25MMX9MM (BLADE)
BLADE AVERAGE 25X9 (BLADE) IMPLANT
BLADE MICRO SAGITTAL (BLADE) ×3 IMPLANT
BLADE OSC/SAG .038X5.5 CUT EDG (BLADE) IMPLANT
BLADE SURG 15 STRL LF DISP TIS (BLADE) ×4 IMPLANT
BLADE SURG 15 STRL SS (BLADE) ×8
BNDG CMPR 9X6 STRL LF SNTH (GAUZE/BANDAGES/DRESSINGS) ×2
BNDG COHESIVE 4X5 TAN STRL (GAUZE/BANDAGES/DRESSINGS) ×4 IMPLANT
BNDG COHESIVE 6X5 TAN STRL LF (GAUZE/BANDAGES/DRESSINGS) ×4 IMPLANT
BNDG ESMARK 6X9 LF (GAUZE/BANDAGES/DRESSINGS) ×4
BNDG GAUZE ELAST 4 BULKY (GAUZE/BANDAGES/DRESSINGS) IMPLANT
BUR EGG 3PK/BX (BURR) IMPLANT
CHLORAPREP W/TINT 26ML (MISCELLANEOUS) ×4 IMPLANT
CLOSURE WOUND 1/2 X4 (GAUZE/BANDAGES/DRESSINGS)
COVER BACK TABLE 60X90IN (DRAPES) ×4 IMPLANT
CUFF TOURNIQUET SINGLE 34IN LL (TOURNIQUET CUFF) ×4 IMPLANT
DECANTER SPIKE VIAL GLASS SM (MISCELLANEOUS) IMPLANT
DRAPE C-ARM 42X72 X-RAY (DRAPES) IMPLANT
DRAPE C-ARMOR (DRAPES) IMPLANT
DRAPE EXTREMITY T 121X128X90 (DRAPE) ×4 IMPLANT
DRAPE OEC MINIVIEW 54X84 (DRAPES) ×4 IMPLANT
DRAPE U-SHAPE 47X51 STRL (DRAPES) ×4 IMPLANT
DRSG MEPITEL 4X7.2 (GAUZE/BANDAGES/DRESSINGS) ×4 IMPLANT
DRSG PAD ABDOMINAL 8X10 ST (GAUZE/BANDAGES/DRESSINGS) ×8 IMPLANT
ELECT REM PT RETURN 9FT ADLT (ELECTROSURGICAL) ×4
ELECTRODE REM PT RTRN 9FT ADLT (ELECTROSURGICAL) ×2 IMPLANT
GAUZE SPONGE 4X4 12PLY STRL (GAUZE/BANDAGES/DRESSINGS) ×4 IMPLANT
GLOVE BIO SURGEON STRL SZ8 (GLOVE) ×4 IMPLANT
GLOVE BIOGEL PI IND STRL 7.0 (GLOVE) IMPLANT
GLOVE BIOGEL PI IND STRL 8 (GLOVE) ×4 IMPLANT
GLOVE BIOGEL PI INDICATOR 7.0 (GLOVE) ×2
GLOVE BIOGEL PI INDICATOR 8 (GLOVE) ×4
GLOVE ECLIPSE 6.5 STRL STRAW (GLOVE) ×3 IMPLANT
GLOVE ECLIPSE 7.5 STRL STRAW (GLOVE) ×4 IMPLANT
GLOVE EXAM NITRILE MD LF STRL (GLOVE) ×2 IMPLANT
GOWN STRL REUS W/ TWL LRG LVL3 (GOWN DISPOSABLE) ×2 IMPLANT
GOWN STRL REUS W/ TWL XL LVL3 (GOWN DISPOSABLE) ×4 IMPLANT
GOWN STRL REUS W/TWL LRG LVL3 (GOWN DISPOSABLE) ×4
GOWN STRL REUS W/TWL XL LVL3 (GOWN DISPOSABLE) ×8
K-WIRE THREADED 1.25 (WIRE) ×4
KWIRE THREADED 1.25 (WIRE) ×1 IMPLANT
NDL HYPO 25X1 1.5 SAFETY (NEEDLE) IMPLANT
NEEDLE HYPO 22GX1.5 SAFETY (NEEDLE) IMPLANT
NEEDLE HYPO 25X1 1.5 SAFETY (NEEDLE) IMPLANT
NS IRRIG 1000ML POUR BTL (IV SOLUTION) ×4 IMPLANT
PACK BASIN DAY SURGERY FS (CUSTOM PROCEDURE TRAY) ×4 IMPLANT
PAD CAST 4YDX4 CTTN HI CHSV (CAST SUPPLIES) ×2 IMPLANT
PADDING CAST ABS 4INX4YD NS (CAST SUPPLIES)
PADDING CAST ABS COTTON 4X4 ST (CAST SUPPLIES) IMPLANT
PADDING CAST COTTON 4X4 STRL (CAST SUPPLIES) ×4
PADDING CAST COTTON 6X4 STRL (CAST SUPPLIES) ×4 IMPLANT
PENCIL BUTTON HOLSTER BLD 10FT (ELECTRODE) ×4 IMPLANT
SANITIZER HAND PURELL 535ML FO (MISCELLANEOUS) ×4 IMPLANT
SCREW CANN 4.0X38MM (Screw) ×4 IMPLANT
SCREW CANN PT 4X36 NS (Screw) IMPLANT
SCREW CANN PT 4X38 NS (Screw) IMPLANT
SCREW CANNULATED NS 4MMX36MM (Screw) ×4 IMPLANT
SCREW CANNULATED PT 4.0X40 (Screw) ×4 IMPLANT
SCREW CORT 3.5X40 (Screw) ×3 IMPLANT
SCREW CORT T15 30X3.5XST LCK (Screw) ×1 IMPLANT
SCREW CORTICAL 3.5X30MM (Screw) ×4 IMPLANT
SHEET MEDIUM DRAPE 40X70 STRL (DRAPES) ×4 IMPLANT
SLEEVE SCD COMPRESS KNEE MED (MISCELLANEOUS) ×4 IMPLANT
SPLINT FAST PLASTER 5X30 (CAST SUPPLIES) ×40
SPLINT PLASTER CAST FAST 5X30 (CAST SUPPLIES) ×40 IMPLANT
SPONGE LAP 18X18 X RAY DECT (DISPOSABLE) ×4 IMPLANT
STOCKINETTE 6  STRL (DRAPES) ×2
STOCKINETTE 6 STRL (DRAPES) ×2 IMPLANT
STRIP CLOSURE SKIN 1/2X4 (GAUZE/BANDAGES/DRESSINGS) IMPLANT
SUCTION FRAZIER HANDLE 10FR (MISCELLANEOUS) ×4
SUCTION TUBE FRAZIER 10FR DISP (MISCELLANEOUS) ×3 IMPLANT
SUT ETHILON 3 0 PS 1 (SUTURE) ×7 IMPLANT
SUT MNCRL AB 3-0 PS2 18 (SUTURE) ×4 IMPLANT
SUT VIC AB 0 SH 27 (SUTURE) IMPLANT
SUT VIC AB 2-0 SH 27 (SUTURE) ×4
SUT VIC AB 2-0 SH 27XBRD (SUTURE) IMPLANT
SYR BULB 3OZ (MISCELLANEOUS) ×4 IMPLANT
SYR CONTROL 10ML LL (SYRINGE) IMPLANT
TOWEL OR 17X24 6PK STRL BLUE (TOWEL DISPOSABLE) ×8 IMPLANT
TUBE CONNECTING 20'X1/4 (TUBING) ×1
TUBE CONNECTING 20X1/4 (TUBING) ×3 IMPLANT
UNDERPAD 30X30 (UNDERPADS AND DIAPERS) ×4 IMPLANT
YANKAUER SUCT BULB TIP NO VENT (SUCTIONS) IMPLANT

## 2016-08-26 NOTE — Transfer of Care (Signed)
Immediate Anesthesia Transfer of Care Note  Patient: Diana Farley  Procedure(s) Performed: Procedure(s): Left First and second  tarsal metatarsal arthrodesis (Left) GASTROCNEMIUS SLIDE (Left)  Patient Location: PACU  Anesthesia Type:GA combined with regional for post-op pain  Level of Consciousness: awake, alert , oriented and patient cooperative  Airway & Oxygen Therapy: Patient Spontanous Breathing and Patient connected to face mask oxygen  Post-op Assessment: Report given to RN and Post -op Vital signs reviewed and stable  Post vital signs: Reviewed and stable  Last Vitals:  Vitals:   08/26/16 0830 08/26/16 0831  BP: 139/88   Pulse: 68 64  Resp: 19 12  Temp:      Last Pain:  Vitals:   08/26/16 0724  TempSrc: Oral  PainSc: 7          Complications: No apparent anesthesia complications

## 2016-08-26 NOTE — Anesthesia Procedure Notes (Signed)
Anesthesia Regional Block:  Nerve block type: saphenous.  Pre-Anesthetic Checklist: ,, timeout performed, Correct Patient, Correct Site, Correct Laterality, Correct Procedure, Correct Position, site marked, Risks and benefits discussed,  Surgical consent,  Pre-op evaluation,  At surgeon's request and post-op pain management  Laterality: Left  Prep: chloraprep       Needles:  Injection technique: Single-shot  Needle Type: Echogenic Stimulator Needle     Needle Length: 10cm 10 cm Needle Gauge: 21 and 21 G    Additional Needles:  Procedures: ultrasound guided (picture in chart) Nerve block type: saphenous. Narrative:  Injection made incrementally with aspirations every 5 mL.  Performed by: Personally  Anesthesiologist: Nilda Simmer

## 2016-08-26 NOTE — Anesthesia Postprocedure Evaluation (Signed)
Anesthesia Post Note  Patient: Diana Farley  Procedure(s) Performed: Procedure(s) (LRB): Left First and second  tarsal metatarsal arthrodesis (Left) GASTROCNEMIUS SLIDE (Left)  Patient location during evaluation: PACU Anesthesia Type: General Level of consciousness: awake and alert Pain management: pain level controlled Vital Signs Assessment: post-procedure vital signs reviewed and stable Respiratory status: spontaneous breathing, nonlabored ventilation and respiratory function stable Cardiovascular status: blood pressure returned to baseline and stable Postop Assessment: no signs of nausea or vomiting Anesthetic complications: no    Last Vitals:  Vitals:   08/26/16 1120 08/26/16 1155  BP:  126/67  Pulse: 64 67  Resp: 18 18  Temp:  36.6 C    Last Pain:  Vitals:   08/26/16 1155  TempSrc:   PainSc: 0-No pain                 Nilda Simmer

## 2016-08-26 NOTE — Anesthesia Procedure Notes (Signed)
Anesthesia Regional Block:  Popliteal block  Pre-Anesthetic Checklist: ,, timeout performed, Correct Patient, Correct Site, Correct Laterality, Correct Procedure, Correct Position, site marked, Risks and benefits discussed,  Surgical consent,  Pre-op evaluation,  At surgeon's request and post-op pain management  Laterality: Left  Prep: chloraprep       Needles:  Injection technique: Single-shot  Needle Type: Echogenic Stimulator Needle     Needle Length: 10cm 10 cm Needle Gauge: 21 and 21 G    Additional Needles:  Procedures: ultrasound guided (picture in chart) Popliteal block Narrative:  Injection made incrementally with aspirations every 5 mL.  Performed by: Personally  Anesthesiologist: Nilda Simmer  Additional Notes: Patient endorses pre-block numbness in foot and lower lateral half of lower leg

## 2016-08-26 NOTE — Anesthesia Procedure Notes (Signed)
Procedure Name: LMA Insertion Date/Time: 08/26/2016 8:56 AM Performed by: Huie Ghuman D Pre-anesthesia Checklist: Patient identified, Emergency Drugs available, Suction available and Patient being monitored Patient Re-evaluated:Patient Re-evaluated prior to inductionOxygen Delivery Method: Circle system utilized Preoxygenation: Pre-oxygenation with 100% oxygen Intubation Type: IV induction Ventilation: Mask ventilation without difficulty LMA: LMA inserted LMA Size: 4.0 Number of attempts: 1 Airway Equipment and Method: Bite block Placement Confirmation: positive ETCO2 Tube secured with: Tape Dental Injury: Teeth and Oropharynx as per pre-operative assessment

## 2016-08-26 NOTE — Brief Op Note (Signed)
08/26/2016  10:41 AM  PATIENT:  Diana Farley  70 y.o. 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  Left First and second  tarsal metatarsal arthrodesis 2.  Left GASTROCNEMIUS recession 3.  AP, lateral and oblique xrays of the left foot  SURGEON:  Wylene Simmer, MD  ASSISTANT: Mechele Claude, PA-C  ANESTHESIA:   General, regional  EBL:  minimal   TOURNIQUET:   Total Tourniquet Time Documented: Thigh (Left) - 82 minutes Total: Thigh (Left) - 82 minutes  COMPLICATIONS:  None apparent  DISPOSITION:  Extubated, awake and stable to recovery.  DICTATION ID:  [id number]

## 2016-08-26 NOTE — Telephone Encounter (Signed)
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  Discussed that there could be another gene, or possibly our technology is not good enough to identify a genetic change.  Discussed keeping in contact with [place] to ensure that if there is updated testing that needs to be performed we can do that.

## 2016-08-26 NOTE — Progress Notes (Signed)
Assisted Dr. Neoma Laming with left, ultrasound guided, popliteal/saphenous block. Side rails up, monitors on throughout procedure. See vital signs in flow sheet. Tolerated Procedure well.

## 2016-08-26 NOTE — H&P (Signed)
Diana Farley is an 70 y.o. female.   Chief Complaint:  Left foot pain HPI: 70 y/o female with left foot pain presents today for surgical treatment.  She has midfoot arthritis which has led to collapse of her arch and chronic pain.  She has failed non op treatment.  Past Medical History:  Diagnosis Date  . Allergy   . Anemia    after first childbirth- 1978, no problems since  . Anxiety   . Arthritis   . Asthma   . Atypical chest pain Aug. 2006   normal adequate exercise stress Moview; EF 74%, normal 2D echo w/ no significant valvular abnormalities  . Cataract    bilateral  . Chest pain   . Chronic headaches   . Dyslipidemia    hypertriglyceridemia/low HDL, on Lipitor.  . Exertional dyspnea   . Family history of colon cancer   . Family history of oral cancer   . Family history of ovarian cancer   . Family history of prostate cancer   . Fibromyalgia   . History of supraventricular tachycardia 1999  . Hypothyroid   . Kidney stones   . Migraine headache    followed by Dr. Asencion Partridge Dohmeier in [place]  . Osteoporosis    wrist  . Other and unspecified hyperlipidemia    hx of  . Other dysphagia   . Peptic ulcer disease    pt denies  . Personal history of colonic polyps   . Pneumonia   . Shortness of breath   . UTI (urinary tract infection)     Past Surgical History:  Procedure Laterality Date  . CATARACT EXTRACTION     both eyes  . CESAREAN SECTION     Two  . COLONOSCOPY    . dental implants    . Left thumb surgery    . POLYPECTOMY    . TUBAL LIGATION      Family History  Problem Relation Age of Onset  . Colon polyps Maternal Aunt   . Colon cancer Paternal Uncle     dx 2 x, first time in his 28s  . Colon cancer Cousin     maternal first cousin  . Stomach cancer Maternal Uncle     dx in his 38s-70  . Prostate cancer Maternal Uncle     dx in his 42s-70s  . Ovarian cancer Maternal Aunt 70  . Ovarian cancer Paternal Aunt 7  . Prostate cancer Cousin     maternal  first cousin  . Head & neck cancer Cousin     maternal first cousin  . Esophageal cancer Neg Hx   . Rectal cancer Neg Hx    Social History:  reports that she quit smoking about 35 years ago. Her smoking use included Cigarettes. She has a 25.50 pack-year smoking history. She has never used smokeless tobacco. She reports that she drinks alcohol. She reports that she does not use drugs.  Allergies: No Known Allergies  Facility-Administered Medications Prior to Admission  Medication Dose Route Frequency Provider Last Rate Last Dose  . 0.9 %  sodium chloride infusion  500 mL Intravenous Continuous Milus Banister, MD       Medications Prior to Admission  Medication Sig Dispense Refill  . alendronate (FOSAMAX) 70 MG tablet TAKE 1 TABLET EVERY WEEK IN THE MORNING, AT LEAST 30 MINUTES BEFORE THE FIRST FOOD, BEVERAGE, OR MEDICATION OF THE DAY    . ALPRAZolam (XANAX) 0.5 MG tablet Take 0.5 mg by mouth at  bedtime as needed for anxiety. Takes if out of clonazepam    . budesonide-formoterol (SYMBICORT) 80-4.5 MCG/ACT inhaler Inhale 2 puffs into the lungs 2 (two) times daily.    . cholecalciferol (VITAMIN D) 1000 units tablet Take 1,000 Units by mouth daily.    . clonazePAM (KLONOPIN) 0.5 MG tablet Take 0.5 mg by mouth at bedtime.      Marland Kitchen HYDROcodone-acetaminophen (LORCET) 10-650 MG per tablet Take 1 tablet by mouth Every 6 hours as needed.    Marland Kitchen levothyroxine (SYNTHROID, LEVOTHROID) 112 MCG tablet Take 112 mcg by mouth daily.      . metoprolol (TOPROL-XL) 25 MG 24 hr tablet Take 25 mg by mouth daily. Place on file for patient.     . senna (SENOKOT) 8.6 MG TABS tablet Take 1 tablet by mouth as needed for mild constipation (4-6 as needed).    . sertraline (ZOLOFT) 100 MG tablet Take 100 mg by mouth daily.    . traZODone (DESYREL) 50 MG tablet Take 150 mg by mouth at bedtime. Take 3 by mouth daily     . albuterol (PROVENTIL HFA;VENTOLIN HFA) 108 (90 Base) MCG/ACT inhaler Inhale into the lungs.    .  fexofenadine (ALLEGRA) 180 MG tablet Take 180 mg by mouth daily as needed.        No results found for this or any previous visit (from the past 48 hour(s)). No results found.  ROS  No recent f/c/n/v/wt loss.   Blood pressure 119/62, pulse 64, temperature 97.9 F (36.6 C), temperature source Oral, resp. rate 18, height 5\' 5"  (1.651 m), weight 62.6 kg (138 lb), SpO2 96 %. Physical Exam  wn wd woman in nad.  A and O x 4.  Mood and affect normal.  EOMI.  Resp unlabored.  L foot with healthy skin.  No lymphadenopathy.  5/5 strength in PF and DF of the toes and ankle.  Sens to LT intact at the dorsal foot.  Gross deformity with collapse of the arch and forefoot abduction.  Assessment/Plan L foot arthritis and tight heelcord - to OR for gastroc recession and midfoot arthrodesis.  The risks and benefits of the alternative treatment options have been discussed in detail.  The patient wishes to proceed with surgery and specifically understands risks of bleeding, infection, nerve damage, blood clots, need for additional surgery, amputation and death.   Wylene Simmer, MD 09-09-2016, 8:26 AM

## 2016-08-26 NOTE — Discharge Instructions (Addendum)
Diana Simmer, MD [place]  Please read the following information regarding your care after surgery.  Medications  You only need a prescription for the narcotic pain medicine (ex. oxycodone, Percocet, Norco).  All of the other medicines listed below are available over the counter. X acetominophen (Tylenol) 650 mg every 4-6 hours as you need for minor pain X oxycodone as prescribed for moderate to severe pain ?   Narcotic pain medicine (ex. oxycodone, Percocet, Vicodin) will cause constipation.  To prevent this problem, take the following medicines while you are taking any pain medicine. X docusate sodium (Colace) 100 mg twice a day X senna (Senokot) 2 tablets twice a day  X To help prevent blood clots, take a baby aspirin (81 mg) twice a day until you are instructed to bear weight on your operative foot after surgery.  You should also get up every hour while you are awake to move around.    Weight Bearing X Do not bear any weight on the operated leg or foot.  Cast / Splint / Dressing X Keep your splint or cast clean and dry.  Dont put anything (coat hanger, pencil, etc) down inside of it.  If it gets damp, use a hair dryer on the cool setting to dry it.  If it gets soaked, call the office to schedule an appointment for a cast change.   After your dressing, cast or splint is removed; you may shower, but do not soak or scrub the wound.  Allow the water to run over it, and then gently pat it dry.  Swelling It is normal for you to have swelling where you had surgery.  To reduce swelling and pain, keep your toes above your nose for at least 3 days after surgery.  It may be necessary to keep your foot or leg elevated for several weeks.  If it hurts, it should be elevated.  Follow Up Call my office at [phone] when you are discharged from the hospital or surgery center to schedule an appointment to be seen two weeks after surgery.  Call my office at [phone] if you  develop a fever >101.5 F, nausea, vomiting, bleeding from the surgical site or severe pain.      Post Anesthesia Home Care Instructions  Activity: Get plenty of rest for the remainder of the day. A responsible adult should stay with you for 24 hours following the procedure.  For the next 24 hours, DO NOT: -Drive a car -Paediatric nurse -Drink alcoholic beverages -Take any medication unless instructed by your physician -Make any legal decisions or sign important papers.  Meals: Start with liquid foods such as gelatin or soup. Progress to regular foods as tolerated. Avoid greasy, spicy, heavy foods. If nausea and/or vomiting occur, drink only clear liquids until the nausea and/or vomiting subsides. Call your physician if vomiting continues.  Special Instructions/Symptoms: Your throat may feel dry or sore from the anesthesia or the breathing tube placed in your throat during surgery. If this causes discomfort, gargle with warm salt water. The discomfort should disappear within 24 hours.  If you had a scopolamine patch placed behind your ear for the management of post- operative nausea and/or vomiting:  1. The medication in the patch is effective for 72 hours, after which it should be removed.  Wrap patch in a tissue and discard in the trash. Wash hands thoroughly with soap and water. 2. You may remove the patch earlier than 72 hours if you experience unpleasant side effects  which may include dry mouth, dizziness or visual disturbances. 3. Avoid touching the patch. Wash your hands with soap and water after contact with the patch.   Regional Anesthesia Blocks  1. Numbness or the inability to move the "blocked" extremity may last from 3-48 hours after placement. The length of time depends on the medication injected and your individual response to the medication. If the numbness is not going away after 48 hours, call your surgeon.  2. The extremity that is blocked will need to be protected  until the numbness is gone and the  Strength has returned. Because you cannot feel it, you will need to take extra care to avoid injury. Because it may be weak, you may have difficulty moving it or using it. You may not know what position it is in without looking at it while the block is in effect.  3. For blocks in the legs and feet, returning to weight bearing and walking needs to be done carefully. You will need to wait until the numbness is entirely gone and the strength has returned. You should be able to move your leg and foot normally before you try and bear weight or walk. You will need someone to be with you when you first try to ensure you do not fall and possibly risk injury.  4. Bruising and tenderness at the needle site are common side effects and will resolve in a few days.  5. Persistent numbness or new problems with movement should be communicated to the surgeon or the [place] [phone] [place] [phone]).  Call your surgeon if you experience:   1.  Fever over 101.0. 2.  Inability to urinate. 3.  Nausea and/or vomiting. 4.  Extreme swelling or bruising at the surgical site. 5.  Continued bleeding from the incision. 6.  Increased pain, redness or drainage from the incision. 7.  Problems related to your pain medication. 8.  Any problems and/or concerns

## 2016-08-27 ENCOUNTER — Encounter (HOSPITAL_BASED_OUTPATIENT_CLINIC_OR_DEPARTMENT_OTHER): Payer: Self-pay | Admitting: Orthopedic Surgery

## 2016-08-27 NOTE — Op Note (Signed)
NAMEJUN, Diana Farley                ACCOUNT NO.:  [id number]  MEDICAL RECORD NO.:  [id number]  LOCATION:                                 FACILITY:  PHYSICIAN:  Wylene Simmer, MD             DATE OF BIRTH:  DATE OF PROCEDURE:  08/26/2016 DATE OF DISCHARGE:                              OPERATIVE REPORT   PREOPERATIVE DIAGNOSES: 1. 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 2. Tight left heel cord.  POSTOPERATIVE DIAGNOSES: 1. 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 2. Tight left heel cord.  PROCEDURES: 1. Left 1st and 2nd TMT joint arthrodesis. 2. Left gastrocnemius recession through a separate incision. 3. AP, lateral, and oblique radiographs of the left foot.  SURGEON:  Wylene Simmer, MD.  ASSISTANTShirley Friar.  ANESTHESIA:  General, regional.  ESTIMATED BLOOD LOSS:  Minimal.  TOURNIQUET TIME:  82 minutes at 250 mmHg.  COMPLICATIONS:  None apparent.  DISPOSITION:  Extubated, awake, and stable to Recovery.  INDICATIONS FOR PROCEDURE:  The patient is a 70 year old woman with a long history of progressive deformity of her left foot.  She has severe arthritis through the midfoot, particularly the 1st and 2nd TMT joints. She presents now for operative treatment of these painful conditions having failed nonoperative treatment to date.  She understands the risks and benefits of the alternative treatment options and elects surgical treatment.  She specifically understands the risks of bleeding, infection, nerve damage, blood clots, need for additional surgery, continued pain, nonunion, amputation, and death.  PROCEDURE IN DETAIL:  After preoperative consent was obtained and the correct operative site was identified, the patient was brought to the operating room and placed supine on the operating table.  General anesthesia was induced.  Preoperative antibiotics were administered. Surgical time-out was taken.  Left lower extremity was prepped  and draped in standard sterile fashion with tourniquet around the thigh. The extremity was exsanguinated and tourniquet was inflated to 250 mmHg. A longitudinal incision was then made over the medial calf.  Sharp dissection was carried down through the skin and subcutaneous tissue. Superficial fascia was incised.  The gastrocnemius tendon was identified.  It was divided from medial to lateral under direct vision taking care to protect the sural nerve.  Plantaris tendon was also divided under direct vision.  Wound was irrigated and closed with Monocryl and nylon.  Attention was then turned to the dorsal aspect of the midfoot, where a longitudinal incision was made over the 1st and 2nd TMT joints.  Sharp dissection was carried down through the skin and subcutaneous tissue. The interval between the extensor hallucis longus and brevis tendons was developed.  Neurovascular bundle was identified.  It was mobilized and retracted laterally.  It was protected throughout the case.  The 1st and 2nd TMT joints were identified.  The dorsal joint capsules were incised. The interosseous ligaments were divided allowing appropriate mobilization of 1st and 2nd TMT joints.  The articular cartilage and subchondral bone on both sides of the joint were removed with a combination of saw, curette, and rongeur.  Both sides of the joint were then perforated with a 2.5  mm drill bit leaving the resultant bone graft in place.  The 1st TMT joint was reduced.  A guide pin was inserted from proximal to distal.  2nd TMT joint was then reduced and again a guide pin placed from proximal to distal.  AP and lateral radiographs confirmed appropriate alignment of the joints and appropriate position of both K-wires.  The K-wires were then over drilled and 4 mm partially- threaded cannulated screws from the [employer] cannulated screw set were inserted.  These were noted to have excellent compression across both 1st and 2nd TMT  joints.  At this point, a tenaculum was placed across the Lisfranc joint complex.  A K-wire was inserted from the medial cuneiform to the base of the 2nd metatarsal in the home-run position. The guide pin was over drilled, and a 3.5 mm fully threaded [employer]omet LPS screw was inserted.  Guide pins were then placed from distal to proximal across the 1st and 2nd TMT joints.  Radiographs confirmed appropriate alignment.  Guide pin was overdrilled and both pins were placed with 3.5 mm LPS screws.  AP, oblique, and lateral radiographs confirmed appropriate reduction of the 1st and 2nd TMT joints and appropriate position and length of all hardware.  No other acute injuries were noted.  The wounds were irrigated.  Superficial fascia was closed with Vicryl, was closed with Monocryl in subcutaneous tissue, and nylon at the skin level.  Sterile dressings were applied followed by well-padded short-leg splint.  Tourniquet was released after application of dressings at 82 minutes.  The patient was awakened from anesthesia and transported to the recovery room in stable condition.  FOLLOWUP PLAN:  The patient will be nonweightbearing on the left foot for the next 6 weeks.  She will follow up with me in the office in 2 weeks for suture removal and conversion to a short-leg cast.  Mechele Claude, PA-C, was present and scrubbed for the duration of the case.  His assistance was essential in positioning the patient, prepping and draping, gaining and maintaining the exposure, performing the operation, closing and dressing the wounds, and applying the splint.  RADIOGRAPHS:  AP, oblique, and lateral radiographs of the left foot were obtained intraoperatively.  These show interval reduction and arthrodesis of the 1st and 2nd TMT joints.  Hardware is appropriately positioned and of the appropriate length.  No other acute injuries are noted.     Wylene Simmer, MD     JH/MEDQ  D:  08/26/2016  T:  08/27/2016   Job:  [id number]

## 2016-08-30 ENCOUNTER — Ambulatory Visit: Payer: Self-pay | Admitting: Genetic Counselor

## 2016-08-30 DIAGNOSIS — Z800 Family history of malignant neoplasm of digestive organs: Secondary | ICD-10-CM

## 2016-08-30 DIAGNOSIS — Z8042 Family history of malignant neoplasm of prostate: Secondary | ICD-10-CM

## 2016-08-30 DIAGNOSIS — Z1379 Encounter for other screening for genetic and chromosomal anomalies: Secondary | ICD-10-CM

## 2016-08-30 DIAGNOSIS — Z86010 Personal history of colonic polyps: Secondary | ICD-10-CM

## 2016-08-30 DIAGNOSIS — Z8041 Family history of malignant neoplasm of ovary: Secondary | ICD-10-CM

## 2016-08-30 NOTE — Progress Notes (Signed)
HPI: Ms. Roell was previously seen in the [facility] clinic due to a personal history of colon polyps and a family history cancer and concerns regarding a hereditary predisposition to cancer. Please refer to our prior cancer genetics clinic note for more information regarding Ms. Kinzler's medical, social and family histories, and our assessment and recommendations, at the time. Ms. Kaufman recent genetic test results were disclosed to her, as were recommendations warranted by these results. These results and recommendations are discussed in more detail below.  FAMILY HISTORY:  We obtained a detailed, 4-generation family history.  Significant diagnoses are listed below: Family History  Problem Relation Age of Onset  . Colon polyps Maternal Aunt   . Colon cancer Paternal Uncle     dx 2 x, first time in his 54s  . Colon cancer Cousin     maternal first cousin  . Stomach cancer Maternal Uncle     dx in his 27s-70  . Prostate cancer Maternal Uncle     dx in his 49s-70s  . Ovarian cancer Maternal Aunt 70  . Ovarian cancer Paternal Aunt 68  . Prostate cancer Cousin     maternal first cousin  . Head & neck cancer Cousin     maternal first cousin  . Esophageal cancer Neg Hx   . Rectal cancer Neg Hx     The patient has two children who are cancer free.  She has a brother and a sister who are cancer free.  Her mother died at 74 from a fall down the stairs.  She had two brothers and two sisters.  One sister had ovarian cancer and a brother had both prostate and stomach cancer.  This brother had two sons, one with prostate cancer and another with colon cancer.  The sister with ovarian cancer had a daughter with multiple oral cancers.  The patient's father died at 38 from complications of rheumatic fever when he was 48.  He had three sisters and a brother. His brother developed colon cancer in his 51's and had it twice.  A sister had ovarian cancer. There is no other reported cancer history.   Patient's maternal ancestors are of [place], [place] and [place] descent, and paternal ancestors are of Caucasian descent. There is no reported Ashkenazi Jewish ancestry. There is no known consanguinity.  GENETIC TEST RESULTS: At the time of Ms. Demby's visit, we recommended she pursue genetic testing of the Common Hereditary cancer gene panel. The Hereditary Gene Panel offered by [facility] includes sequencing and/or deletion duplication testing of the following 42 genes: APC, ATM, AXIN2, BARD1, BMPR1A, BRCA1, BRCA2, BRIP1, CDH1, CDKN2A, CHEK2, DICER1, EPCAM, GREM1, KIT, MEN1, MLH1, MSH2, MSH6, MUTYH, NBN, NF1, PALB2, PDGFRA, PMS2, POLD1, POLE, PTEN, RAD50, RAD51C, RAD51D, SDHA, SDHB, SDHC, SDHD, SMAD4, SMARCA4. STK11, TP53, TSC1, TSC2, and VHL.  The report date is August 25, 2016.  Genetic testing was normal, and did not reveal a deleterious mutation in these genes. The test report has been scanned into EPIC and is located under the Molecular Pathology section of the Results Review tab.   We discussed with Ms. Maggio that since the current genetic testing is not perfect, it is possible there may be a gene mutation in one of these genes that current testing cannot detect, but that chance is small. We also discussed, that it is possible that another gene that has not yet been discovered, or that we have not yet tested, is responsible for the cancer diagnoses in the  family, and it is, therefore, important to remain in touch with cancer genetics in the future so that we can continue to offer Ms. Toelle the most up to date genetic testing.   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  At this time, it is unknown if this variant is associated with increased cancer risk or if this is a normal finding, but most variants such as this get reclassified to being inconsequential. It should not be used to make medical management decisions. With time, we suspect the lab  will determine the significance of this variant, if any. If we do learn more about it, we will try to contact Ms. Temkin to discuss it further. However, it is important to stay in touch with [place] periodically and keep the address and phone number up to date.   CANCER SCREENING RECOMMENDATIONS: This result is reassuring and indicates that Ms. Goodie likely does not have an increased risk for a future cancer due to a mutation in one of these genes. This normal test also suggests that Ms. Putz's colon polyps were most likely not due to an inherited predisposition associated with one of these genes.  Most cancers happen by chance and this negative test suggests that her cancer falls into this category.  We, therefore, recommended she continue to follow the cancer management and screening guidelines provided by her oncology and primary healthcare provider.   RECOMMENDATIONS FOR FAMILY MEMBERS: Women in this family might be at some increased risk of developing cancer, over the general population risk, simply due to the family history of cancer. We recommended women in this family have a yearly mammogram beginning at age 59, or 62 years younger than the earliest onset of cancer, an an annual clinical breast exam, and perform monthly breast self-exams. Women in this family should also have a gynecological exam as recommended by their primary provider. All family members should have a colonoscopy by age 25.  FOLLOW-UP: Lastly, we discussed with Ms. Mckiver that cancer genetics is a rapidly advancing field and it is possible that new genetic tests will be appropriate for her and/or her family members in the future. We encouraged her to remain in contact with cancer genetics on an annual basis so we can update her personal and family histories and let her know of advances in cancer genetics that may benefit this family.   Our contact number was provided. Ms. Sprowl questions were answered to her satisfaction, and she knows  she is welcome to call us at anytime with additional questions or concerns.   Roma Kayser, MS, [facility] Certified Genetic Counselor [email or web address]

## 2016-11-18 ENCOUNTER — Ambulatory Visit
Admission: RE | Admit: 2016-11-18 | Discharge: 2016-11-18 | Disposition: A | Discharge status: Home / Self Care | Payer: Medicare Other | Source: Ambulatory Visit | Attending: Obstetrics and Gynecology | Admitting: Obstetrics and Gynecology

## 2016-11-18 DIAGNOSIS — Z1231 Encounter for screening mammogram for malignant neoplasm of breast: Secondary | ICD-10-CM

## 2016-12-17 ENCOUNTER — Telehealth: Payer: Self-pay | Admitting: Genetic Counselor

## 2016-12-17 NOTE — Telephone Encounter (Signed)
The patient called and LM on my VM asking about her genetic testing.  She had concerns whether she had been tested for Lynch syndrome.  Patient had testing for a 42 gene panel through [facility].  I discussed that LYnch syndrome is a condition made up of 5 genes.  I pointed out the genes to her that are associated with Lynch syndrome - EPCAM, MLH1, MSH2, MSH6 and PMS2.  These were all negative and did not have a VUS or pathogenic mutation within them.  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  PALB2 is associated with a breast/pancreatic cancer syndrome.  Neither of these cancers run in her family.  Most VUS are reclassified in the future and typically are determined to be inconsequential.  We will recontact her once this is reclassified.  She may want to keep in contact with genetics every year or two to determine if there is additional testing that may be warranted.  Patient voiced her understanding.

## 2017-03-10 ENCOUNTER — Encounter (HOSPITAL_COMMUNITY): Payer: Self-pay

## 2017-04-07 ENCOUNTER — Other Ambulatory Visit: Payer: Self-pay | Admitting: Orthopedic Surgery

## 2017-04-07 DIAGNOSIS — G8929 Other chronic pain: Secondary | ICD-10-CM

## 2017-04-07 DIAGNOSIS — M79672 Pain in left foot: Principal | ICD-10-CM

## 2017-04-08 ENCOUNTER — Ambulatory Visit
Admission: RE | Admit: 2017-04-08 | Discharge: 2017-04-08 | Disposition: A | Discharge status: Home / Self Care | Payer: Medicare Other | Source: Ambulatory Visit | Attending: Orthopedic Surgery | Admitting: Orthopedic Surgery

## 2017-04-08 DIAGNOSIS — M79672 Pain in left foot: Principal | ICD-10-CM

## 2017-04-08 DIAGNOSIS — G8929 Other chronic pain: Secondary | ICD-10-CM

## 2017-04-25 ENCOUNTER — Other Ambulatory Visit: Payer: Self-pay | Admitting: Orthopedic Surgery

## 2017-06-09 ENCOUNTER — Encounter (HOSPITAL_BASED_OUTPATIENT_CLINIC_OR_DEPARTMENT_OTHER): Payer: Self-pay

## 2017-06-09 ENCOUNTER — Ambulatory Visit (HOSPITAL_BASED_OUTPATIENT_CLINIC_OR_DEPARTMENT_OTHER): Admit: 2017-06-09 | Payer: Medicare Other | Admitting: Orthopedic Surgery

## 2017-06-09 SURGERY — REMOVAL, HARDWARE
Anesthesia: General | Laterality: Left

## 2017-07-15 ENCOUNTER — Other Ambulatory Visit: Payer: Self-pay | Admitting: Obstetrics and Gynecology

## 2017-07-15 DIAGNOSIS — Z1231 Encounter for screening mammogram for malignant neoplasm of breast: Secondary | ICD-10-CM

## 2017-07-26 ENCOUNTER — Telehealth: Payer: Self-pay | Admitting: Genetic Counselor

## 2017-07-26 NOTE — Telephone Encounter (Signed)
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  This is still classified as a VUS.  Looked in [facility] and other labs still call this a VUS.  She will CB next year. 

## 2017-11-23 ENCOUNTER — Ambulatory Visit
Admission: RE | Admit: 2017-11-23 | Discharge: 2017-11-23 | Disposition: A | Discharge status: Home / Self Care | Payer: PRIVATE HEALTH INSURANCE | Source: Ambulatory Visit | Attending: Obstetrics and Gynecology | Admitting: Obstetrics and Gynecology

## 2017-11-23 DIAGNOSIS — Z1231 Encounter for screening mammogram for malignant neoplasm of breast: Secondary | ICD-10-CM

## 2018-01-26 ENCOUNTER — Encounter: Payer: Self-pay | Admitting: Physician Assistant

## 2018-01-26 ENCOUNTER — Ambulatory Visit (INDEPENDENT_AMBULATORY_CARE_PROVIDER_SITE_OTHER): Payer: Medicare Other | Admitting: Physician Assistant

## 2018-01-26 VITALS — BP 104/60 | HR 68 | Ht 65.0 in | Wt 139.0 lb

## 2018-01-26 DIAGNOSIS — R1013 Epigastric pain: Secondary | ICD-10-CM | POA: Diagnosis not present

## 2018-01-26 MED ORDER — TRAMADOL HCL 50 MG PO TABS: 50.0000 mg | ORAL_TABLET | Freq: Four times a day (QID) | ORAL | 0 refills | Status: DC | PRN

## 2018-01-26 NOTE — Patient Instructions (Signed)
You have been scheduled for an endoscopy. Please follow written instructions given to you at your visit today. If you use inhalers (even only as needed), please bring them with you on the day of your procedure. Your physician has requested that you go to www.startemmi.com and enter the access code given to you at your visit today. This web site gives a general overview about your procedure. However, you should still follow specific instructions given to you by our office regarding your preparation for the procedure. Continue Protonix 40 mg, take 1 tab every morning. Continue Zantac 150 mg- take 1 tab by mouth at bedtime. Start the Carafate 1 gram= take 4 times daily between meals and bedtime.   We have faxed the Ultram prescription to your pharmacy.   If you are age 44 or older, your body mass index should be between 23-30. Your Body mass index is 23.13 kg/m. If this is out of the aforementioned range listed, please consider follow up with your Primary Care Provider.

## 2018-01-26 NOTE — Progress Notes (Signed)
Subjective:    Patient ID: Diana Farley, female    DOB: 1946-08-26, 72 y.o.   MRN: [id number]  HPI Diana Farley is a pleasant 72 year old white female, known to Dr. [name] from prior colonoscopies. She was last seen here in August 2017. She had colonoscopy at that time with finding of one 7 mm polyp in the transverse colon and 4 other 4-5 mm sessile appearing polyps all of which were removed. Biopsies were consistent with tubular adenomas and she is indicated for 3 year interval follow-up. She had an EGD in 2010 which was normal. She comes in today with no complaints of epigastric pain which she says started at the beginning of January and has been persistent. She describes it as an aching sore pain that eventually became burning in nature. She was started on pantoprazole by her PCP a few weeks ago and then had Zantac added at bedtime just over the past few days. He says this seems to be helping a little and she's not quite as sore though most of the burning persists. Appetite has been okay, no nausea or vomiting and no change in symptoms with by mouth intake. She has not had any fever or chills, no dysphagia or odynophagia, no heartburn. Weight has been stable no changes in bowel habits melena or hematochezia. She's not been using any aspirin or NSAIDs. She had labs done about 3 weeks ago through her PCPs office in [place]. She showed me the results on her phone and a CBC and CMET were both entirely normal.  Review of Systems Pertinent positive and negative review of systems were noted in the above HPI section.  All other review of systems was otherwise negative.  Outpatient Encounter Medications as of 01/26/2018  Medication Sig  . albuterol (PROVENTIL HFA;VENTOLIN HFA) 108 (90 Base) MCG/ACT inhaler Inhale into the lungs.  Marland Kitchen alendronate (FOSAMAX) 70 MG tablet TAKE 1 TABLET EVERY WEEK IN THE MORNING, AT LEAST 30 MINUTES BEFORE THE FIRST FOOD, BEVERAGE, OR MEDICATION OF THE DAY  .  budesonide-formoterol (SYMBICORT) 80-4.5 MCG/ACT inhaler Inhale 2 puffs into the lungs 2 (two) times daily as needed.   . cholecalciferol (VITAMIN D) 1000 units tablet Take 1,000 Units by mouth daily.  . clonazePAM (KLONOPIN) 0.5 MG tablet Take 0.5 mg by mouth at bedtime.    Marland Kitchen levothyroxine (SYNTHROID, LEVOTHROID) 112 MCG tablet Take 112 mcg by mouth daily.    . metoprolol (TOPROL-XL) 25 MG 24 hr tablet Take 25 mg by mouth daily. Place on file for patient.   . pantoprazole (PROTONIX) 40 MG tablet Take 40 mg by mouth daily.  . ranitidine (ZANTAC) 150 MG tablet Take 150 mg by mouth at bedtime.  . senna (SENOKOT) 8.6 MG TABS tablet Take 2 tablets (17.2 mg total) by mouth 2 (two) times daily.  . sertraline (ZOLOFT) 100 MG tablet Take 100 mg by mouth daily.  . traZODone (DESYREL) 50 MG tablet Take 150 mg by mouth at bedtime.   . traMADol (ULTRAM) 50 MG tablet Take 1 tablet (50 mg total) by mouth every 6 (six) hours as needed.  . [DISCONTINUED] ALPRAZolam (XANAX) 0.5 MG tablet Take 0.5 mg by mouth at bedtime as needed for anxiety. Takes if out of clonazepam  . [DISCONTINUED] aspirin EC 81 MG tablet Take 1 tablet (81 mg total) by mouth 2 (two) times daily.  . [DISCONTINUED] docusate sodium (COLACE) 100 MG capsule Take 1 capsule (100 mg total) by mouth 2 (two) times daily. While taking narcotic pain medicine.  . [  DISCONTINUED] fexofenadine (ALLEGRA) 180 MG tablet Take 180 mg by mouth daily as needed.    . [DISCONTINUED] oxyCODONE (ROXICODONE) 5 MG immediate release tablet Take 1-2 tablets (5-10 mg total) by mouth every 4 (four) hours as needed for moderate pain or severe pain.   No facility-administered encounter medications on file as of 01/26/2018.    No Known Allergies Patient Active Problem List   Diagnosis Date Noted  . Genetic testing 08/26/2016  . Family history of ovarian cancer   . Family history of colon cancer   . Family history of prostate cancer   . Family history of oral cancer   .  Peptic ulcer disease   . Anxiety   . 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 INSOMNIA 06/10/2010  . MIGRAINE HEADACHE 10/14/2009  . HYPERLIPIDEMIA-MIXED 08/18/2009  . SVT/ PSVT/ PAT 08/18/2009  . DYSPNEA 08/18/2009  . CHEST PAIN-UNSPECIFIED 08/18/2009  . DYSPHAGIA 01/06/2009  . PERSONAL HX COLONIC POLYPS 01/06/2009   Social History   Socioeconomic History  . Marital status: Widowed    Spouse name: Not on file  . Number of children: 2  . Years of education: Not on file  . Highest education level: Not on file  Social Needs  . Financial resource strain: Not on file  . Food insecurity - worry: Not on file  . Food insecurity - inability: Not on file  . Transportation needs - medical: Not on file  . Transportation needs - non-medical: Not on file  Occupational History  . Not on file  Tobacco Use  . Smoking status: Former Smoker    Packs/day: 1.50    Years: 17.00    Pack years: 25.50    Types: Cigarettes    Last attempt to quit: 05/20/1981    Years since quitting: 36.7  . Smokeless tobacco: Never Used  Substance and Sexual Activity  . Alcohol use: Yes    Comment: occ glass of wine with dinner  . Drug use: No  . Sexual activity: Not on file  Other Topics Concern  . Not on file  Social History Narrative  . Not on file    Diana Farley's family history includes Colon cancer in her cousin and paternal uncle; Colon polyps in her maternal aunt; Head & neck cancer in her cousin; Ovarian cancer (age of onset: 70) in her maternal aunt; Ovarian cancer (age of onset: 19) in her paternal aunt; Prostate cancer in her cousin and maternal uncle; Stomach cancer in her maternal uncle.      Objective:    Vitals:   01/26/18 0856  BP: 104/60  Pulse: 68    Physical Exam; well-developed older white female in no acute distress, pleasant blood pressure 104/60 pulse 68, height 5 foot 5, weight 139, BMI of 23.1. HEENT ;nontraumatic normocephalic EOMI PERRLA sclera anicteric, Cardiovascular;  regular rate and rhythm with S1-S2 no murmur or gallop, Pulmonary ;clear bilaterally, Abdomen ;soft, bowel sounds are present she has mild tenderness in the epigastrium and subxiphoid area no palpable mass or hepatosplenomegaly,, no definite costal margin tenderness, Rectal; exam not done, Extremities; no clubbing cyanosis or edema skin warm and dry, Neuropsych; mood and affect appropriate       Assessment & Plan:   #51 72 year old white female with several week history of persistent burning and aching epigastric pain, there is been some minimal improvement with addition of pantoprazole and ranitidine. Etiology of her pain is not clear, rule out peptic ulcer disease, gastritis, occult gastric lesion or other intra-abdominal inflammatory  process #2 history of multiple tubular adenomatous colon polyps-up-to-date with colonoscopy last done in August 2017 due for follow-up August 2020 #3 history of SVT and PSVT #4 hyperlipidemia  Plan; continue Protonix 40 mg by mouth every morning Continue Zantac 150 mg by mouth daily at bedtime Add Carafate 1 g by mouth 4 times daily between meals and at bedtime Patient asked for pain medication, she was given a prescription for Ultram 50 mg every 6 hours when necessary for pain #30 and no refills. Patient will be scheduled for upper endoscopy with Dr. [name]. Procedure was discussed in detail with patient including indications risks and benefits and she is agreeable to proceed. If EGD is unrevealing she will need further abdominal imaging with CT of the abdomen and pelvis with contrast.   Amy S Esterwood PA-C 01/26/2018   Cc: Olena Mater, MD

## 2018-01-27 ENCOUNTER — Ambulatory Visit (AMBULATORY_SURGERY_CENTER): Payer: Medicare Other | Admitting: Gastroenterology

## 2018-01-27 ENCOUNTER — Encounter: Payer: Self-pay | Admitting: Gastroenterology

## 2018-01-27 ENCOUNTER — Other Ambulatory Visit: Payer: Self-pay

## 2018-01-27 ENCOUNTER — Ambulatory Visit: Payer: PRIVATE HEALTH INSURANCE | Admitting: Physician Assistant

## 2018-01-27 VITALS — BP 106/62 | HR 59 | Temp 98.0°F | Resp 12 | Ht 65.0 in | Wt 139.0 lb

## 2018-01-27 DIAGNOSIS — K2970 Gastritis, unspecified, without bleeding: Secondary | ICD-10-CM | POA: Diagnosis not present

## 2018-01-27 DIAGNOSIS — K2990 Gastroduodenitis, unspecified, without bleeding: Secondary | ICD-10-CM | POA: Diagnosis not present

## 2018-01-27 DIAGNOSIS — R1013 Epigastric pain: Secondary | ICD-10-CM | POA: Diagnosis not present

## 2018-01-27 DIAGNOSIS — K2950 Unspecified chronic gastritis without bleeding: Secondary | ICD-10-CM | POA: Diagnosis not present

## 2018-01-27 MED ORDER — SODIUM CHLORIDE 0.9 % IV SOLN: 500.0000 mL | Freq: Once | INTRAVENOUS | Status: DC

## 2018-01-27 NOTE — Progress Notes (Signed)
Called to room to assist during endoscopic procedure.  Patient ID and intended procedure confirmed with present staff. Received instructions for my participation in the procedure from the performing physician.  

## 2018-01-27 NOTE — Progress Notes (Signed)
I agree with the above note, plan 

## 2018-01-27 NOTE — Op Note (Signed)
[facility] Patient Name: Diana Farley Procedure Date: 01/27/2018 10:24 AM MRN: [id number] Endoscopist: Milus Banister , MD Age: 72 Referring MD:  Date of Birth: January 30, 1946 Gender: Female Account #: [id number] Procedure:                Upper GI endoscopy Indications:              Epigastric abdominal pain Medicines:                Monitored Anesthesia Care Procedure:                Pre-Anesthesia Assessment:                           - Prior to the procedure, a History and Physical                            was performed, and patient medications and                            allergies were reviewed. The patient's tolerance of                            previous anesthesia was also reviewed. The risks                            and benefits of the procedure and the sedation                            options and risks were discussed with the patient.                            All questions were answered, and informed consent                            was obtained. Prior Anticoagulants: The patient has                            taken no previous anticoagulant or antiplatelet                            agents. ASA Grade Assessment: II - A patient with                            mild systemic disease. After reviewing the risks                            and benefits, the patient was deemed in                            satisfactory condition to undergo the procedure.                           After obtaining informed consent, the endoscope was  passed under direct vision. Throughout the                            procedure, the patient's blood pressure, pulse, and                            oxygen saturations were monitored continuously. The                            Endoscope was introduced through the mouth, and                            advanced to the second part of duodenum. The                            Endoscope was introduced through the  and advanced                            to the. The upper GI endoscopy was accomplished                            without difficulty. The patient tolerated the                            procedure well. Scope In: Scope Out: Findings:                 The esophagus was normal.                           Mild inflammation characterized by erythema,                            friability and granularity was found in the entire                            examined stomach. Biopsies were taken with a cold                            forceps for histology.                           The examined duodenum was normal. Complications:            No immediate complications. Estimated blood loss:                            None. Estimated Blood Loss:     Estimated blood loss: none. Impression:               - Mild gastritis, biopsied. Recommendation:           - Patient has a contact number available for                            emergencies. The signs and symptoms of potential  delayed complications were discussed with the                            patient. Return to normal activities tomorrow.                            Written discharge instructions were provided to the                            patient.                           - Resume previous diet.                           - Continue present medications.                           - Await pathology results. If the biopsies show H.                            pylori, you will be started on appropriate                            antibiotics. If not, will likely arrange further                            testing (CT scan abdomen, pelvis). Milus Banister, MD 01/27/2018 10:39:58 AM This report has been signed electronically.

## 2018-01-27 NOTE — Patient Instructions (Signed)
YOU HAD AN ENDOSCOPIC PROCEDURE TODAY AT [facility]:   Refer to the procedure report that was given to you for any specific questions about what was found during the examination.  If the procedure report does not answer your questions, please call your gastroenterologist to clarify.  If you requested that your care partner not be given the details of your procedure findings, then the procedure report has been included in a sealed envelope for you to review at your convenience later.  YOU SHOULD EXPECT: Some feelings of bloating in the abdomen. Passage of more gas than usual.  Walking can help get rid of the air that was put into your GI tract during the procedure and reduce the bloating. If you had a lower endoscopy (such as a colonoscopy or flexible sigmoidoscopy) you may notice spotting of blood in your stool or on the toilet paper. If you underwent a bowel prep for your procedure, you may not have a normal bowel movement for a few days.  Please Note:  You might notice some irritation and congestion in your nose or some drainage.  This is from the oxygen used during your procedure.  There is no need for concern and it should clear up in a day or so.  SYMPTOMS TO REPORT IMMEDIATELY:    Following upper endoscopy (EGD)  Vomiting of blood or coffee ground material  New chest pain or pain under the shoulder blades  Painful or persistently difficult swallowing  New shortness of breath  Fever of 100F or higher  Black, tarry-looking stools   Please see handout given to you on Gastritis.  For urgent or emergent issues, a gastroenterologist can be reached at any hour by calling [phone].   DIET:  We do recommend a small meal at first, but then you may proceed to your regular diet.  Drink plenty of fluids but you should avoid alcoholic beverages for 24 hours.  ACTIVITY:  You should plan to take it easy for the rest of today and you should NOT DRIVE or use heavy machinery  until tomorrow (because of the sedation medicines used during the test).    FOLLOW UP: Our staff will call the number listed on your records the next business day following your procedure to check on you and address any questions or concerns that you may have regarding the information given to you following your procedure. If we do not reach you, we will leave a message.  However, if you are feeling well and you are not experiencing any problems, there is no need to return our call.  We will assume that you have returned to your regular daily activities without incident.  If any biopsies were taken you will be contacted by phone or by letter within the next 1-3 weeks.  Please call us at [phone] if you have not heard about the biopsies in 3 weeks.    SIGNATURES/CONFIDENTIALITY: You and/or your care partner have signed paperwork which will be entered into your electronic medical record.  These signatures attest to the fact that that the information above on your After Visit Summary has been reviewed and is understood.  Full responsibility of the confidentiality of this discharge information lies with you and/or your care-partner.  Thank you for letting us take care of your healthcare needs today.

## 2018-01-27 NOTE — Progress Notes (Signed)
Teeth unchanged after procedure.A and O x3. Report to RN. Tolerated MAC anesthesia well.

## 2018-01-30 ENCOUNTER — Telehealth: Payer: Self-pay | Admitting: *Deleted

## 2018-01-30 ENCOUNTER — Telehealth: Payer: Self-pay

## 2018-01-30 NOTE — Telephone Encounter (Signed)
NO ANSWER, MESSAGE LEFT FOR PATIENT. 

## 2018-01-30 NOTE — Telephone Encounter (Signed)
  Follow up Call-  Call back number 01/27/2018 08/04/2016  Post procedure Call Back phone  # [phone]  Permission to leave phone message Yes Yes  Some recent data might be hidden     Patient questions:  Do you have a fever, pain , or abdominal swelling? No. Pain Score  0 *  Have you tolerated food without any problems? Yes.    Have you been able to return to your normal activities? Yes.    Do you have any questions about your discharge instructions: Diet   No. Medications  No. Follow up visit  No.  Do you have questions or concerns about your Care? No.  Actions: * If pain score is 4 or above: No action needed, pain <4.

## 2018-09-12 ENCOUNTER — Other Ambulatory Visit: Payer: Self-pay | Admitting: Women's Health

## 2018-09-12 DIAGNOSIS — Z1231 Encounter for screening mammogram for malignant neoplasm of breast: Secondary | ICD-10-CM

## 2018-12-12 ENCOUNTER — Ambulatory Visit
Admission: RE | Admit: 2018-12-12 | Discharge: 2018-12-12 | Disposition: A | Discharge status: Home / Self Care | Payer: Medicare Other | Source: Ambulatory Visit | Attending: Women's Health | Admitting: Women's Health

## 2018-12-12 DIAGNOSIS — Z1231 Encounter for screening mammogram for malignant neoplasm of breast: Secondary | ICD-10-CM

## 2019-06-21 DIAGNOSIS — M722 Plantar fascial fibromatosis: Secondary | ICD-10-CM | POA: Insufficient documentation

## 2019-07-10 ENCOUNTER — Encounter: Payer: Self-pay | Admitting: Gastroenterology

## 2019-07-16 ENCOUNTER — Encounter: Payer: Self-pay | Admitting: Gastroenterology

## 2019-08-01 ENCOUNTER — Other Ambulatory Visit: Payer: Self-pay

## 2019-08-01 ENCOUNTER — Ambulatory Visit (AMBULATORY_SURGERY_CENTER): Payer: Self-pay | Admitting: *Deleted

## 2019-08-01 VITALS — Temp 97.1°F | Ht 65.0 in | Wt 137.0 lb

## 2019-08-01 DIAGNOSIS — Z86010 Personal history of colonic polyps: Secondary | ICD-10-CM

## 2019-08-01 DIAGNOSIS — J4530 Mild persistent asthma, uncomplicated: Secondary | ICD-10-CM | POA: Insufficient documentation

## 2019-08-01 MED ORDER — NA SULFATE-K SULFATE-MG SULF 17.5-3.13-1.6 GM/177ML PO SOLN: ORAL | 0 refills | Status: DC

## 2019-08-01 NOTE — Progress Notes (Signed)
Patient denies any allergies to eggs or soy. Patient denies any problems with anesthesia/sedation. Patient denies any oxygen use at home. Patient denies taking any diet/weight loss medications or blood thinners. EMMI education assisgned to patient on colonoscopy, this was explained and instructions given to patient. Patient request same prep as she did last time, Suprep. Suprep PNM coupon given to pt. pt aware to keep taking Senna as directed. Pt is aware that care partner will wait in the car during procedure; if they feel like they will be too hot to wait in the car; they may wait in the lobby.  We want them to wear a mask (we do not have any that we can provide them), practice social distancing, and we will check their temperatures when they get here.  I did remind patient that their care partner needs to stay in the parking lot the entire time. Pt will wear mask into building.

## 2019-08-10 ENCOUNTER — Telehealth: Payer: Self-pay | Admitting: Gastroenterology

## 2019-08-10 NOTE — Telephone Encounter (Signed)

## 2019-08-10 NOTE — Telephone Encounter (Signed)
Pt responded "no" to all screening questions °

## 2019-08-13 ENCOUNTER — Other Ambulatory Visit: Payer: Self-pay

## 2019-08-13 ENCOUNTER — Ambulatory Visit (AMBULATORY_SURGERY_CENTER): Payer: Medicare Other | Admitting: Gastroenterology

## 2019-08-13 ENCOUNTER — Encounter: Payer: Self-pay | Admitting: Gastroenterology

## 2019-08-13 ENCOUNTER — Encounter: Payer: PRIVATE HEALTH INSURANCE | Admitting: Gastroenterology

## 2019-08-13 VITALS — BP 104/57 | HR 61 | Temp 96.8°F | Resp 14 | Ht 65.0 in | Wt 137.0 lb

## 2019-08-13 DIAGNOSIS — D124 Benign neoplasm of descending colon: Secondary | ICD-10-CM | POA: Diagnosis not present

## 2019-08-13 DIAGNOSIS — Z86010 Personal history of colonic polyps: Secondary | ICD-10-CM | POA: Diagnosis present

## 2019-08-13 DIAGNOSIS — D123 Benign neoplasm of transverse colon: Secondary | ICD-10-CM | POA: Diagnosis not present

## 2019-08-13 DIAGNOSIS — D125 Benign neoplasm of sigmoid colon: Secondary | ICD-10-CM | POA: Diagnosis not present

## 2019-08-13 MED ORDER — SODIUM CHLORIDE 0.9 % IV SOLN: 500.0000 mL | Freq: Once | INTRAVENOUS | Status: DC

## 2019-08-13 NOTE — Op Note (Signed)
[facility] Patient Name: Diana Farley Procedure Date: 08/13/2019 3:15 PM MRN: [id number] Endoscopist: Milus Banister , MD Age: 73 Referring MD:  Date of Birth: [birth year] Gender: Female Account #: [id number] Procedure:                Colonoscopy Indications:              High risk colon cancer surveillance: Personal                            history of colonic polyps; Adenomatous colon                            polyps:5 adenomas removed 2010, one with HGD;                            repeat colonoscopy 1 year later found 2 small                           adenomas. Colonoscopy 05/2013 [name] found seven                            small polyps all of which were small adenomas;                            Colonoscopy 2017 five subCM adenomas removed. Medicines:                Monitored Anesthesia Care Procedure:                Pre-Anesthesia Assessment:                           - Prior to the procedure, a History and Physical                            was performed, and patient medications and                            allergies were reviewed. The patient's tolerance of                            previous anesthesia was also reviewed. The risks                            and benefits of the procedure and the sedation                            options and risks were discussed with the patient.                            All questions were answered, and informed consent                            was obtained. Prior Anticoagulants: The patient has  taken no previous anticoagulant or antiplatelet                            agents. ASA Grade Assessment: II - A patient with                            mild systemic disease. After reviewing the risks                            and benefits, the patient was deemed in                            satisfactory condition to undergo the procedure.                           After obtaining informed consent, the  colonoscope                            was passed under direct vision. Throughout the                            procedure, the patient's blood pressure, pulse, and                            oxygen saturations were monitored continuously. The                            Colonoscope was introduced through the anus and                            advanced to the the cecum, identified by                            appendiceal orifice and ileocecal valve. The                            colonoscopy was performed without difficulty. The                            patient tolerated the procedure well. The quality                            of the bowel preparation was good. The ileocecal                            valve, appendiceal orifice, and rectum were                            photographed. Scope In: 3:16:37 PM Scope Out: 3:38:01 PM Scope Withdrawal Time: 0 hours 16 minutes 8 seconds  Total Procedure Duration: 0 hours 21 minutes 24 seconds  Findings:                 Six sessile polyps were found in the descending  colon and transverse colon. The polyps were 2 to 6                            mm in size. Three were removed with cold snare and                            three with forceps. Resection and retrieval were                            complete. Jar 1.                           A 11 mm polyp was found in the sigmoid colon. The                            polyp was pedunculated. The polyp was removed with                            a hot snare. Resection and retrieval were complete.                            Jar 2.                           Multiple small-mouthed diverticula were found in                            the left colon.                           Melanosis changes throughout the colon.                           The exam was otherwise without abnormality on                            direct and retroflexion views. Complications:            No  immediate complications. Estimated blood loss:                            None. Estimated Blood Loss:     Estimated blood loss: none. Impression:               - Six 2 to 6 mm polyps in the descending colon and                            in the transverse colon, removed with a cold snare.                            Resected and retrieved.                           - One 11 mm polyp in the sigmoid colon, removed  with a hot snare. Resected and retrieved.                           - Diverticulosis in the left colon.                           - Melanosis.                           - The examination was otherwise normal on direct                            and retroflexion views. Recommendation:           - Patient has a contact number available for                            emergencies. The signs and symptoms of potential                            delayed complications were discussed with the                            patient. Return to normal activities tomorrow.                            Written discharge instructions were provided to the                            patient.                           - Resume previous diet.                           - Continue present medications.                           - Repeat colonoscopy is recommended. The                            colonoscopy date will be determined after pathology                            results from today's exam become available for                            review. Likely 3-5 years. Milus Banister, MD 08/13/2019 3:45:50 PM This report has been signed electronically.

## 2019-08-13 NOTE — Progress Notes (Signed)
Report to PACU, RN, vss, BBS= Clear.  

## 2019-08-13 NOTE — Patient Instructions (Addendum)
  Handouts given : polyps  PER DISCUSSION WITH DR JACOBS: YOU MAY TAKE PEPCID OR ITS GENERITIC  YOU HAD AN ENDOSCOPIC PROCEDURE TODAY AT [facility]:   Refer to the procedure report that was given to you for any specific questions about what was found during the examination.  If the procedure report does not answer your questions, please call your gastroenterologist to clarify.  If you requested that your care partner not be given the details of your procedure findings, then the procedure report has been included in a sealed envelope for you to review at your convenience later.  YOU SHOULD EXPECT: Some feelings of bloating in the abdomen. Passage of more gas than usual.  Walking can help get rid of the air that was put into your GI tract during the procedure and reduce the bloating. If you had a lower endoscopy (such as a colonoscopy or flexible sigmoidoscopy) you may notice spotting of blood in your stool or on the toilet paper. If you underwent a bowel prep for your procedure, you may not have a normal bowel movement for a few days.  Please Note:  You might notice some irritation and congestion in your nose or some drainage.  This is from the oxygen used during your procedure.  There is no need for concern and it should clear up in a day or so.  SYMPTOMS TO REPORT IMMEDIATELY:   Following lower endoscopy (colonoscopy or flexible sigmoidoscopy):  Excessive amounts of blood in the stool  Significant tenderness or worsening of abdominal pains  Swelling of the abdomen that is new, acute  Fever of 100F or higher   For urgent or emergent issues, a gastroenterologist can be reached at any hour by calling [phone].   DIET:  We do recommend a small meal at first, but then you may proceed to your regular diet.  Drink plenty of fluids but you should avoid alcoholic beverages for 24 hours.  ACTIVITY:  You should plan to take it easy for the rest of today and you should NOT  DRIVE or use heavy machinery until tomorrow (because of the sedation medicines used during the test).    FOLLOW UP: Our staff will call the number listed on your records 48-72 hours following your procedure to check on you and address any questions or concerns that you may have regarding the information given to you following your procedure. If we do not reach you, we will leave a message.  We will attempt to reach you two times.  During this call, we will ask if you have developed any symptoms of COVID 19. If you develop any symptoms (ie: fever, flu-like symptoms, shortness of breath, cough etc.) before then, please call [phone].  If you test positive for Covid 19 in the 2 weeks post procedure, please call and report this information to [place].    If any biopsies were taken you will be contacted by phone or by letter within the next 1-3 weeks.  Please call us at [phone] if you have not heard about the biopsies in 3 weeks.    SIGNATURES/CONFIDENTIALITY: You and/or your care partner have signed paperwork which will be entered into your electronic medical record.  These signatures attest to the fact that that the information above on your After Visit Summary has been reviewed and is understood.  Full responsibility of the confidentiality of this discharge information lies with you and/or your care-partner.

## 2019-08-13 NOTE — Progress Notes (Signed)
Called to room to assist during endoscopic procedure.  Patient ID and intended procedure confirmed with present staff. Received instructions for my participation in the procedure from the performing physician.  

## 2019-08-13 NOTE — Progress Notes (Signed)
Pt's states no medical or surgical changes since previsit or office visit.  Temp-June bullock  Vital signs-courtney washington 

## 2019-08-15 ENCOUNTER — Telehealth: Payer: Self-pay

## 2019-08-15 ENCOUNTER — Telehealth: Payer: Self-pay | Admitting: *Deleted

## 2019-08-15 NOTE — Telephone Encounter (Signed)
No answer, left message to call back later today, B.Kennieth Plotts RN. 

## 2019-08-15 NOTE — Telephone Encounter (Signed)
  Follow up Call-  Call back number 08/13/2019 01/27/2018  Post procedure Call Back phone  # [phone]  Permission to leave phone message Yes Yes  Some recent data might be hidden     Patient questions:  Do you have a fever, pain , or abdominal swelling? No. Pain Score  0 *  Have you tolerated food without any problems? Yes.    Have you been able to return to your normal activities? Yes.    Do you have any questions about your discharge instructions: Diet   No. Medications  No. Follow up visit  No.  Do you have questions or concerns about your Care? No.  Actions: * If pain score is 4 or above: No action needed, pain <4.  1. Have you developed a fever since your procedure? no  2.   Have you had an respiratory symptoms (SOB or cough) since your procedure? no  3.   Have you tested positive for COVID 19 since your procedure no  4.   Have you had any family members/close contacts diagnosed with the COVID 19 since your procedure?  no   If yes to any of these questions please route to Joylene John, RN and Alphonsa Gin, Therapist, sports.

## 2019-08-17 ENCOUNTER — Encounter: Payer: Self-pay | Admitting: Gastroenterology

## 2019-12-03 ENCOUNTER — Other Ambulatory Visit: Payer: Self-pay | Admitting: Women's Health

## 2019-12-03 ENCOUNTER — Other Ambulatory Visit: Payer: Self-pay | Admitting: Obstetrics and Gynecology

## 2019-12-03 DIAGNOSIS — Z1231 Encounter for screening mammogram for malignant neoplasm of breast: Secondary | ICD-10-CM

## 2020-01-21 ENCOUNTER — Other Ambulatory Visit: Payer: Self-pay

## 2020-01-21 ENCOUNTER — Ambulatory Visit
Admission: RE | Admit: 2020-01-21 | Discharge: 2020-01-21 | Disposition: A | Discharge status: Home / Self Care | Payer: Medicare Other | Source: Ambulatory Visit | Attending: Obstetrics and Gynecology | Admitting: Obstetrics and Gynecology

## 2020-01-21 DIAGNOSIS — Z1231 Encounter for screening mammogram for malignant neoplasm of breast: Secondary | ICD-10-CM

## 2020-01-22 ENCOUNTER — Ambulatory Visit: Payer: Medicare Other

## 2020-04-11 ENCOUNTER — Other Ambulatory Visit: Payer: Self-pay | Admitting: Women's Health

## 2020-04-11 DIAGNOSIS — N6320 Unspecified lump in the left breast, unspecified quadrant: Secondary | ICD-10-CM

## 2020-04-22 ENCOUNTER — Other Ambulatory Visit: Payer: Self-pay | Admitting: Obstetrics & Gynecology

## 2020-04-23 ENCOUNTER — Other Ambulatory Visit: Payer: Self-pay

## 2020-04-23 ENCOUNTER — Ambulatory Visit
Admission: RE | Admit: 2020-04-23 | Discharge: 2020-04-23 | Disposition: A | Discharge status: Home / Self Care | Payer: Medicare Other | Source: Ambulatory Visit | Attending: Women's Health | Admitting: Women's Health

## 2020-04-23 DIAGNOSIS — N6320 Unspecified lump in the left breast, unspecified quadrant: Secondary | ICD-10-CM

## 2020-05-28 DIAGNOSIS — T849XXA Unspecified complication of internal orthopedic prosthetic device, implant and graft, initial encounter: Secondary | ICD-10-CM | POA: Insufficient documentation

## 2020-09-03 IMAGING — MG DIGITAL SCREENING BILATERAL MAMMOGRAM WITH TOMO AND CAD
8 series · 9 of 24 positions shown · non-contrast
Comparison: Previous exam(s).

CLINICAL DATA: Screening.

EXAM:
DIGITAL SCREENING BILATERAL MAMMOGRAM WITH TOMO AND CAD

[L CC synth-2D]
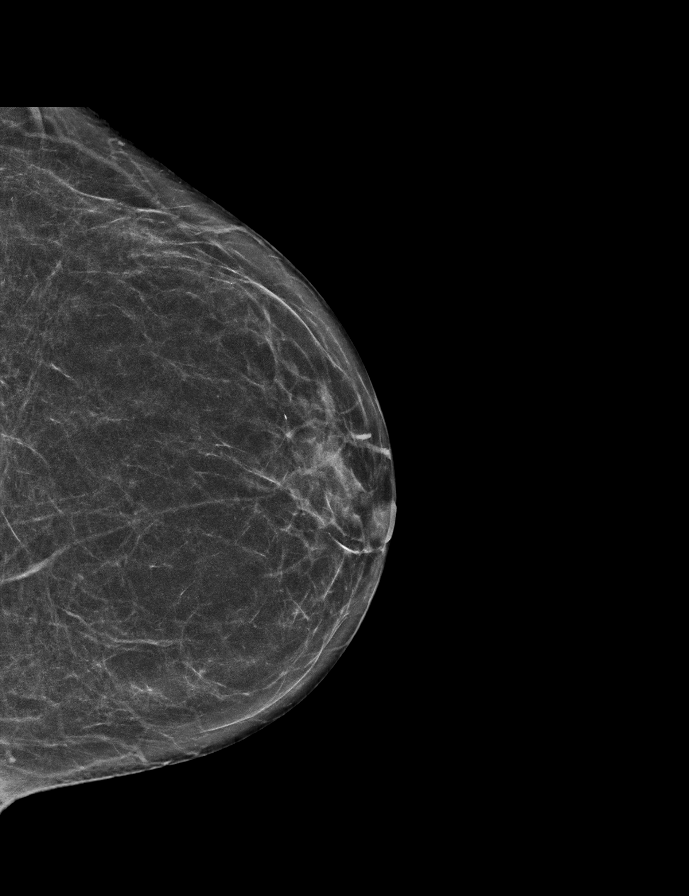

[R CC synth-2D]
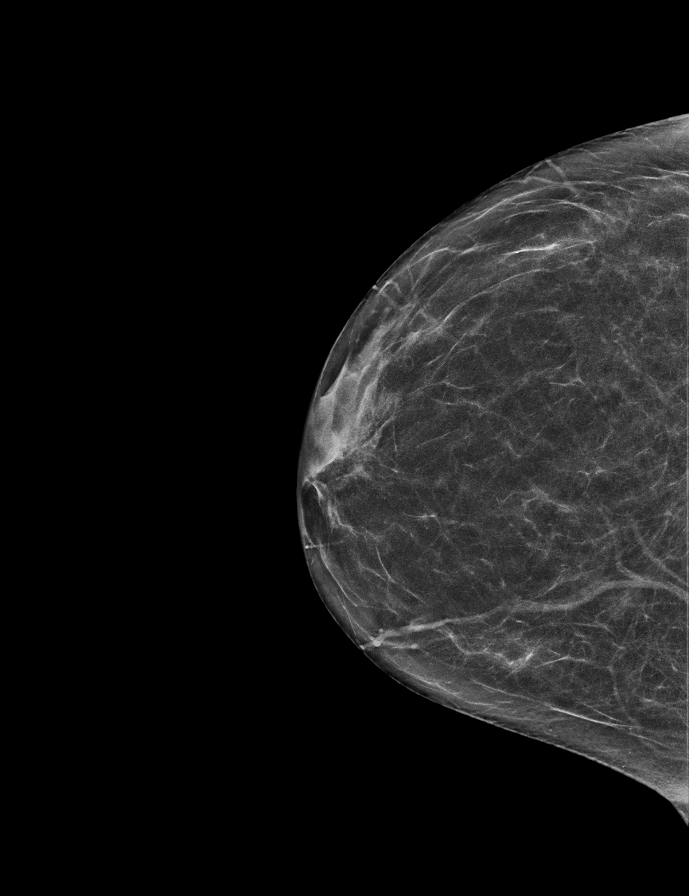

[L MLO synth-2D]
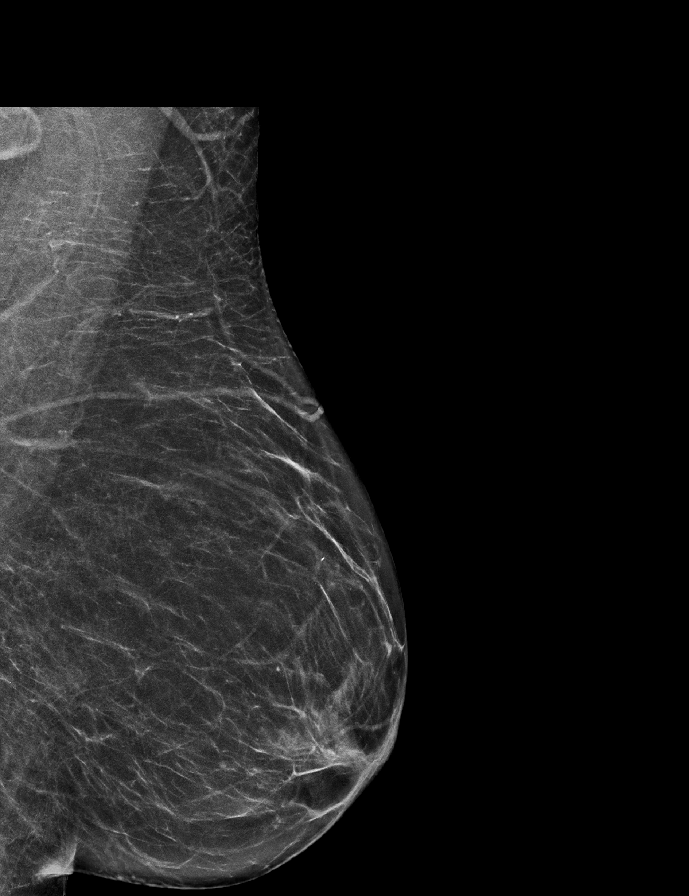

[R MLO synth-2D]
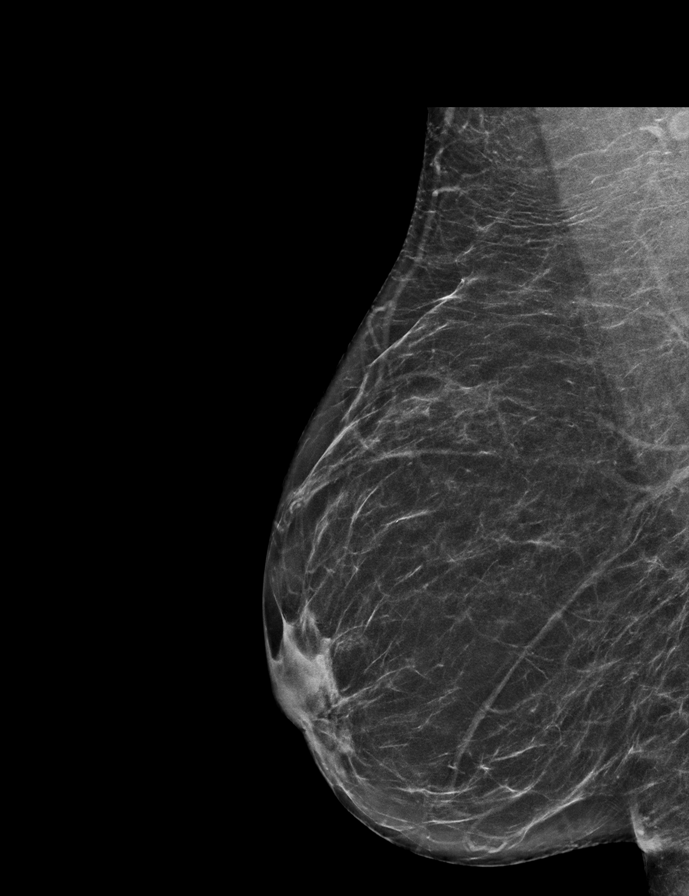

[R CC tomo · 2 of 55 frames shown]
[frame 18/55]
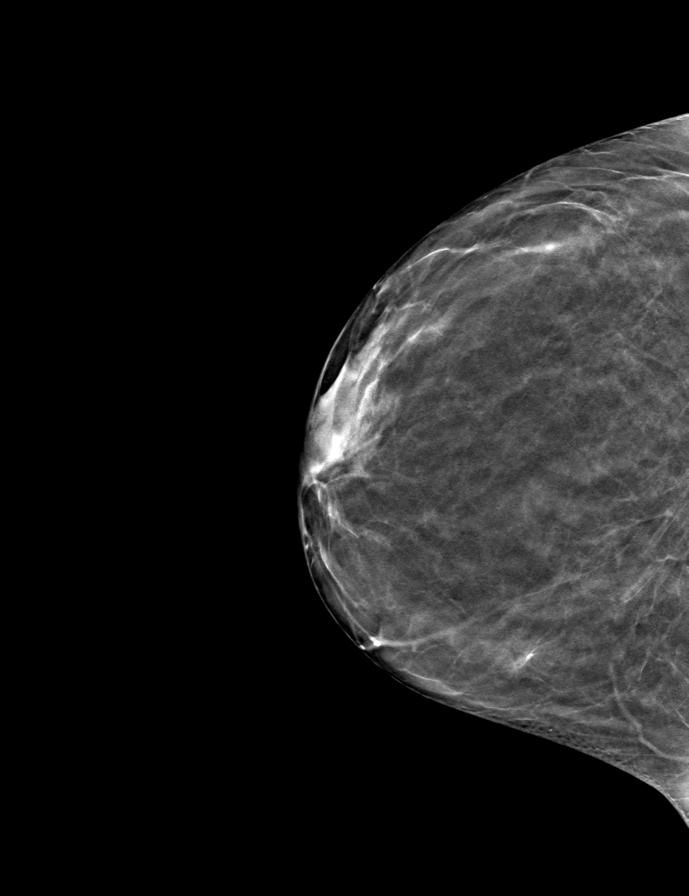
[frame 28/55]
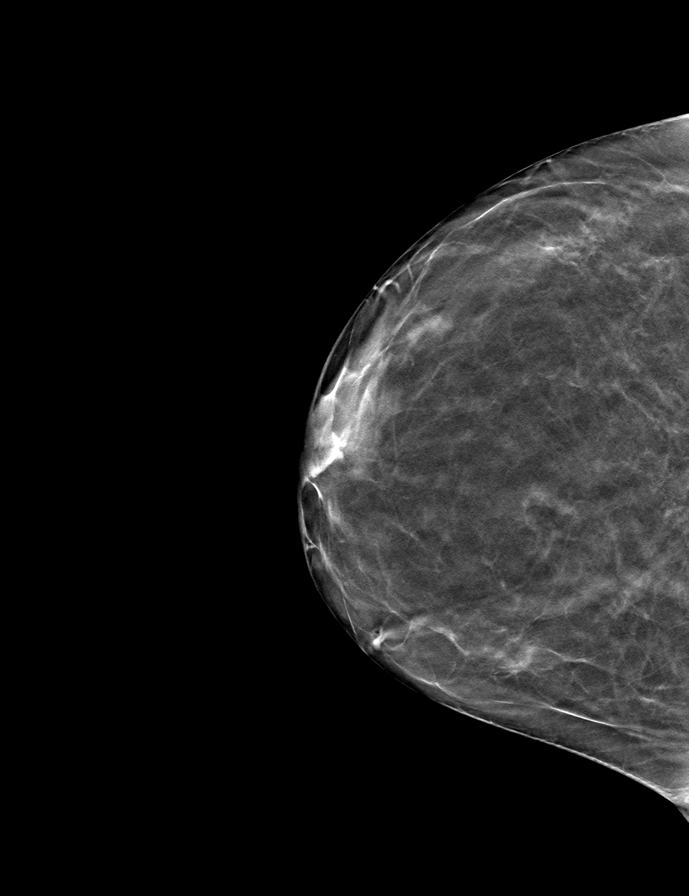

[L CC tomo · tomo slice 28/55.0]
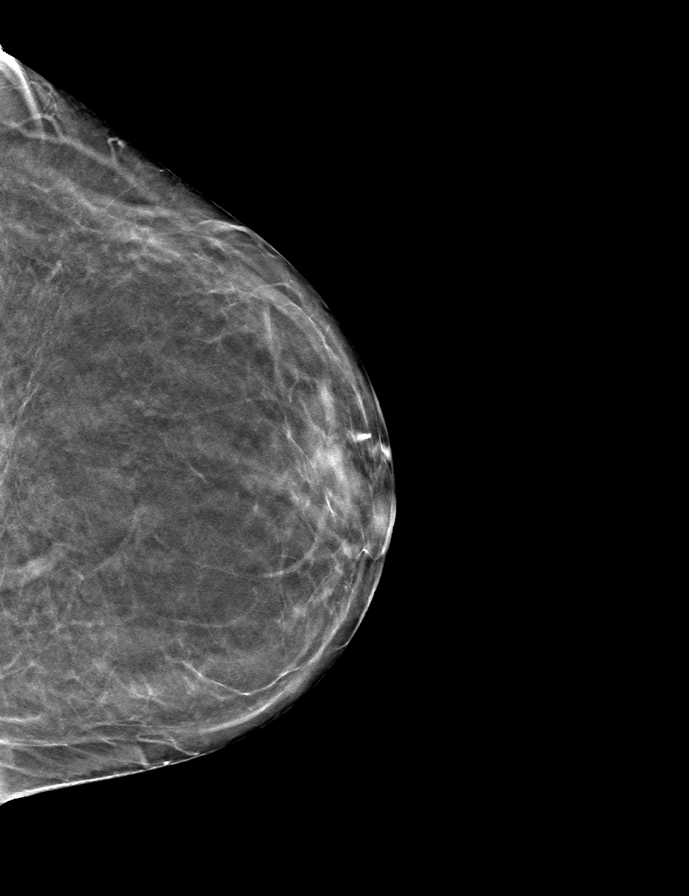

[L MLO tomo · tomo slice 31/60.0]
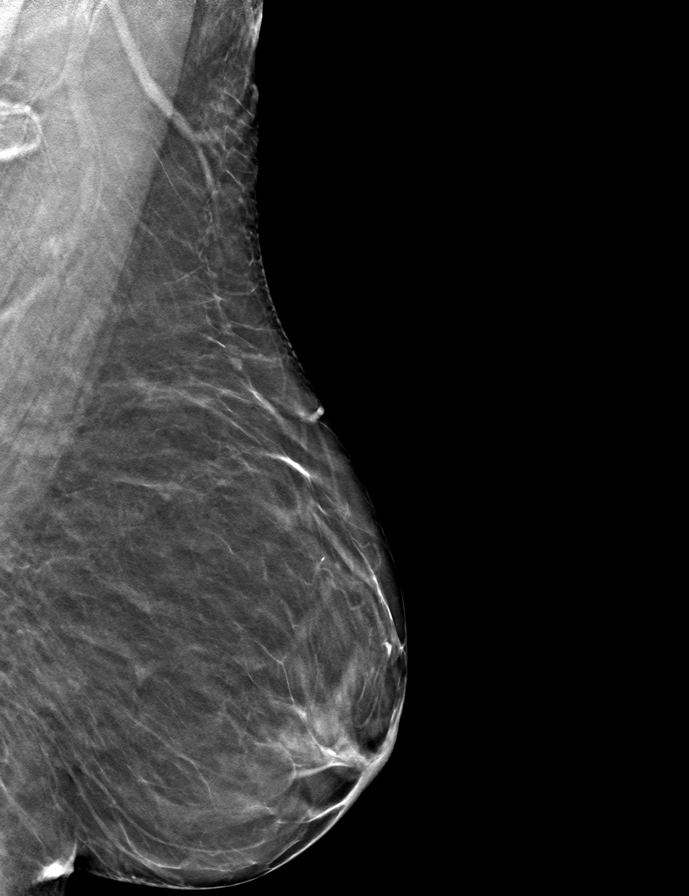

[R MLO tomo · tomo slice 30/59.0]
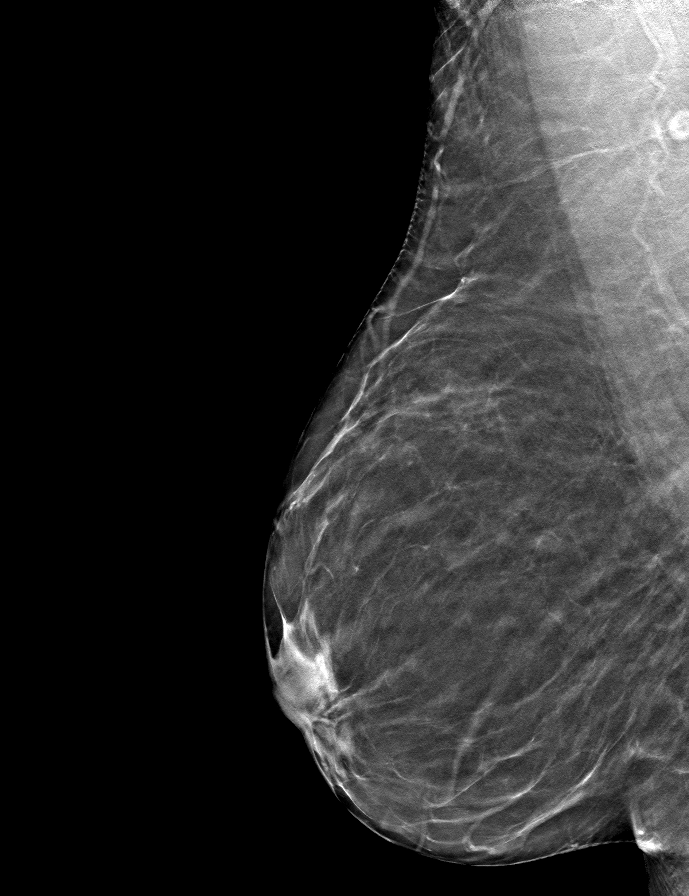

[9 of 24 positions shown; findings below may reference images not displayed]

ACR Breast Density Category b: There are scattered areas of
fibroglandular density.
FINDINGS: There are no findings suspicious for malignancy. Images were
processed with CAD.
IMPRESSION: No mammographic evidence of malignancy. A result letter of this
screening mammogram will be mailed directly to the patient.

RECOMMENDATION:
Screening mammogram in one year. (Code:[id number])

BI-RADS CATEGORY  1: Negative.

## 2021-01-02 ENCOUNTER — Other Ambulatory Visit: Payer: Self-pay | Admitting: Obstetrics and Gynecology

## 2021-01-02 DIAGNOSIS — Z1231 Encounter for screening mammogram for malignant neoplasm of breast: Secondary | ICD-10-CM

## 2021-01-28 DIAGNOSIS — S92333A Displaced fracture of third metatarsal bone, unspecified foot, initial encounter for closed fracture: Secondary | ICD-10-CM | POA: Insufficient documentation

## 2021-02-16 ENCOUNTER — Ambulatory Visit: Payer: Medicare Other

## 2021-04-08 ENCOUNTER — Ambulatory Visit
Admission: RE | Admit: 2021-04-08 | Discharge: 2021-04-08 | Disposition: A | Discharge status: Home / Self Care | Payer: Medicare Other | Source: Ambulatory Visit | Attending: Obstetrics and Gynecology | Admitting: Obstetrics and Gynecology

## 2021-04-08 ENCOUNTER — Other Ambulatory Visit: Payer: Self-pay

## 2021-04-08 DIAGNOSIS — Z1231 Encounter for screening mammogram for malignant neoplasm of breast: Secondary | ICD-10-CM

## 2021-04-10 ENCOUNTER — Other Ambulatory Visit: Payer: Self-pay | Admitting: Obstetrics and Gynecology

## 2021-04-10 DIAGNOSIS — N63 Unspecified lump in unspecified breast: Secondary | ICD-10-CM

## 2021-04-17 ENCOUNTER — Other Ambulatory Visit: Payer: Self-pay | Admitting: Women's Health

## 2021-04-17 ENCOUNTER — Other Ambulatory Visit: Payer: Self-pay | Admitting: Internal Medicine

## 2021-04-20 ENCOUNTER — Other Ambulatory Visit: Payer: Self-pay | Admitting: Internal Medicine

## 2021-04-23 ENCOUNTER — Other Ambulatory Visit: Payer: Self-pay | Admitting: Women's Health

## 2021-04-24 DIAGNOSIS — Z79899 Other long term (current) drug therapy: Secondary | ICD-10-CM | POA: Insufficient documentation

## 2021-05-01 ENCOUNTER — Other Ambulatory Visit: Payer: Self-pay | Admitting: Internal Medicine

## 2021-05-05 ENCOUNTER — Other Ambulatory Visit: Payer: Self-pay | Admitting: Internal Medicine

## 2021-05-05 DIAGNOSIS — N63 Unspecified lump in unspecified breast: Secondary | ICD-10-CM

## 2021-05-06 ENCOUNTER — Ambulatory Visit
Admission: RE | Admit: 2021-05-06 | Discharge: 2021-05-06 | Disposition: A | Discharge status: Home / Self Care | Payer: Medicare Other | Source: Ambulatory Visit | Attending: Obstetrics and Gynecology | Admitting: Obstetrics and Gynecology

## 2021-05-06 ENCOUNTER — Other Ambulatory Visit: Payer: Self-pay

## 2021-05-06 DIAGNOSIS — N63 Unspecified lump in unspecified breast: Secondary | ICD-10-CM

## 2021-05-07 ENCOUNTER — Other Ambulatory Visit: Payer: Medicare Other

## 2021-08-31 DIAGNOSIS — M47812 Spondylosis without myelopathy or radiculopathy, cervical region: Secondary | ICD-10-CM | POA: Insufficient documentation

## 2021-11-09 ENCOUNTER — Other Ambulatory Visit: Payer: Self-pay | Admitting: Obstetrics and Gynecology

## 2021-11-09 DIAGNOSIS — N6320 Unspecified lump in the left breast, unspecified quadrant: Secondary | ICD-10-CM

## 2021-11-26 ENCOUNTER — Ambulatory Visit
Admission: RE | Admit: 2021-11-26 | Discharge: 2021-11-26 | Disposition: A | Discharge status: Home / Self Care | Payer: Medicare Other | Source: Ambulatory Visit | Attending: Obstetrics and Gynecology | Admitting: Obstetrics and Gynecology

## 2021-11-26 DIAGNOSIS — N6320 Unspecified lump in the left breast, unspecified quadrant: Secondary | ICD-10-CM

## 2022-05-31 ENCOUNTER — Encounter: Payer: Self-pay | Admitting: Gastroenterology

## 2022-06-25 ENCOUNTER — Encounter: Payer: Self-pay | Admitting: Gastroenterology

## 2022-08-20 ENCOUNTER — Other Ambulatory Visit: Payer: Self-pay

## 2022-08-20 ENCOUNTER — Ambulatory Visit (AMBULATORY_SURGERY_CENTER): Payer: Self-pay

## 2022-08-20 VITALS — Ht 65.0 in | Wt 131.0 lb

## 2022-08-20 DIAGNOSIS — Z86010 Personal history of colonic polyps: Secondary | ICD-10-CM

## 2022-08-20 NOTE — Progress Notes (Signed)
Denies allergies to eggs or soy products. Denies complication of anesthesia or sedation. Denies use of weight loss medication. Denies use of O2.   Emmi instructions given for colonoscopy.    Patients PV was conducted by phone. Patients home address was verified and instructions were mailed. Patient verbalizes understanding of instructions.

## 2022-08-27 ENCOUNTER — Telehealth: Payer: Self-pay | Admitting: Gastroenterology

## 2022-08-27 NOTE — Telephone Encounter (Signed)
Sent Miralax prep instructions via Mychart today. She did confirm she received them via Mychart. I also printed a copy and placed on 2nd floor for pt to pick up a copy. Pt is aware to come to 2nd floor.

## 2022-08-27 NOTE — Telephone Encounter (Signed)
Patient called states she did not receive prep instructions. Requesting a call back as soon as possible. Please call to advise

## 2022-09-03 ENCOUNTER — Encounter: Payer: Medicare Other | Admitting: Gastroenterology

## 2022-09-03 ENCOUNTER — Encounter: Payer: Self-pay | Admitting: Gastroenterology

## 2022-09-03 ENCOUNTER — Ambulatory Visit (AMBULATORY_SURGERY_CENTER): Payer: Medicare Other | Admitting: Gastroenterology

## 2022-09-03 VITALS — BP 119/68 | HR 81 | Temp 98.0°F | Resp 13 | Ht 65.0 in | Wt 131.0 lb

## 2022-09-03 DIAGNOSIS — D123 Benign neoplasm of transverse colon: Secondary | ICD-10-CM | POA: Diagnosis not present

## 2022-09-03 DIAGNOSIS — D125 Benign neoplasm of sigmoid colon: Secondary | ICD-10-CM

## 2022-09-03 DIAGNOSIS — D122 Benign neoplasm of ascending colon: Secondary | ICD-10-CM | POA: Diagnosis not present

## 2022-09-03 DIAGNOSIS — Z09 Encounter for follow-up examination after completed treatment for conditions other than malignant neoplasm: Secondary | ICD-10-CM

## 2022-09-03 DIAGNOSIS — K6389 Other specified diseases of intestine: Secondary | ICD-10-CM

## 2022-09-03 DIAGNOSIS — D124 Benign neoplasm of descending colon: Secondary | ICD-10-CM | POA: Diagnosis not present

## 2022-09-03 DIAGNOSIS — Z86010 Personal history of colonic polyps: Secondary | ICD-10-CM | POA: Diagnosis not present

## 2022-09-03 MED ORDER — SODIUM CHLORIDE 0.9 % IV SOLN: 500.0000 mL | Freq: Once | INTRAVENOUS | Status: DC

## 2022-09-03 NOTE — Patient Instructions (Signed)
12 polyps removed- await pathology  Please read over handouts about polyps, hemorrhoids and high fiber diets  Please continue your normal medications; take FiberCon 1-2 tablets daily   YOU HAD AN ENDOSCOPIC PROCEDURE TODAY AT [facility]:   Refer to the procedure report that was given to you for any specific questions about what was found during the examination.  If the procedure report does not answer your questions, please call your gastroenterologist to clarify.  If you requested that your care partner not be given the details of your procedure findings, then the procedure report has been included in a sealed envelope for you to review at your convenience later.  YOU SHOULD EXPECT: Some feelings of bloating in the abdomen. Passage of more gas than usual.  Walking can help get rid of the air that was put into your GI tract during the procedure and reduce the bloating. If you had a lower endoscopy (such as a colonoscopy or flexible sigmoidoscopy) you may notice spotting of blood in your stool or on the toilet paper. If you underwent a bowel prep for your procedure, you may not have a normal bowel movement for a few days.  Please Note:  You might notice some irritation and congestion in your nose or some drainage.  This is from the oxygen used during your procedure.  There is no need for concern and it should clear up in a day or so.  SYMPTOMS TO REPORT IMMEDIATELY:  Following lower endoscopy (colonoscopy or flexible sigmoidoscopy):  Excessive amounts of blood in the stool  Significant tenderness or worsening of abdominal pains  Swelling of the abdomen that is new, acute  Fever of 100F or higher  For urgent or emergent issues, a gastroenterologist can be reached at any hour by calling [phone]. Do not use MyChart messaging for urgent concerns.    DIET:  We do recommend a small meal at first, but then you may proceed to your regular diet.  Drink plenty of fluids but  you should avoid alcoholic beverages for 24 hours.  ACTIVITY:  You should plan to take it easy for the rest of today and you should NOT DRIVE or use heavy machinery until tomorrow (because of the sedation medicines used during the test).    FOLLOW UP: Our staff will call the number listed on your records the next business day following your procedure.  We will call around 7:15- 8:00 am to check on you and address any questions or concerns that you may have regarding the information given to you following your procedure. If we do not reach you, we will leave a message.     If any biopsies were taken you will be contacted by phone or by letter within the next 1-3 weeks.  Please call us at [phone] if you have not heard about the biopsies in 3 weeks.    SIGNATURES/CONFIDENTIALITY: You and/or your care partner have signed paperwork which will be entered into your electronic medical record.  These signatures attest to the fact that that the information above on your After Visit Summary has been reviewed and is understood.  Full responsibility of the confidentiality of this discharge information lies with you and/or your care-partner.

## 2022-09-03 NOTE — Progress Notes (Signed)
Called to room to assist during endoscopic procedure.  Patient ID and intended procedure confirmed with present staff. Received instructions for my participation in the procedure from the performing physician.  

## 2022-09-03 NOTE — Progress Notes (Signed)
Pt's states no medical or surgical changes since previsit or office visit. 

## 2022-09-03 NOTE — Progress Notes (Signed)
Report to PACU, RN, vss, BBS= Clear.  

## 2022-09-03 NOTE — Op Note (Signed)
[place] Patient Name: Diana Farley Procedure Date: 09/03/2022 3:29 PM MRN: [id number] Endoscopist: Justice Britain , MD Age: 76 Referring MD:  Date of Birth: [birth year] Gender: Female Account #: [id number] Procedure:                Colonoscopy Indications:              Surveillance: Personal history of adenomatous                            polyps on last colonoscopy 3 years ago Medicines:                Monitored Anesthesia Care Procedure:                Pre-Anesthesia Assessment:                           - Prior to the procedure, a History and Physical                            was performed, and patient medications and                            allergies were reviewed. The patient's tolerance of                            previous anesthesia was also reviewed. The risks                            and benefits of the procedure and the sedation                            options and risks were discussed with the patient.                            All questions were answered, and informed consent                            was obtained. Prior Anticoagulants: The patient has                            taken no previous anticoagulant or antiplatelet                            agents. ASA Grade Assessment: II - A patient with                            mild systemic disease. After reviewing the risks                            and benefits, the patient was deemed in                            satisfactory condition to undergo the procedure.  After obtaining informed consent, the colonoscope                            was passed under direct vision. Throughout the                            procedure, the patient's blood pressure, pulse, and                            oxygen saturations were monitored continuously. The                            Olympus PCF-H190DL ([id number]) Colonoscope was                            introduced through the  anus and advanced to the the                            cecum, identified by appendiceal orifice and                            ileocecal valve. The colonoscopy was performed                            without difficulty. The patient tolerated the                            procedure. The quality of the bowel preparation was                            adequate. The ileocecal valve, appendiceal orifice,                            and rectum were photographed. Scope In: 3:43:09 PM Scope Out: 4:10:36 PM Scope Withdrawal Time: 0 hours 22 minutes 12 seconds  Total Procedure Duration: 0 hours 27 minutes 27 seconds  Findings:                 The digital rectal exam findings include                            hemorrhoids. Pertinent negatives include no                            palpable rectal lesions.                           The left colon was moderately tortuous.                           Diffuse melanosis was found in the entire colon.                           12, sessile polyps were found in the sigmoid colon                            (  1), descending colon (4), transverse colon (5) and                            ascending colon (2). The polyps were 2 to 6 mm in                            size. These polyps were removed with a cold snare.                            Resection and retrieval were complete.                           Non-bleeding non-thrombosed external and internal                            hemorrhoids were found during retroflexion, during                            perianal exam and during digital exam. The                            hemorrhoids were Grade II (internal hemorrhoids                            that prolapse but reduce spontaneously). Complications:            No immediate complications. Estimated Blood Loss:     Estimated blood loss was minimal. Impression:               - Hemorrhoids found on digital rectal exam.                           - Tortuous  colon.                           - Melanosis in the colon.                           - 12, 2 to 6 mm polyps in the sigmoid colon, in the                            descending colon, in the transverse colon and in                            the ascending colon, removed with a cold snare.                            Resected and retrieved.                           - Non-bleeding non-thrombosed external and internal                            hemorrhoids. Recommendation:           - The patient  will be observed post-procedure,                            until all discharge criteria are met.                           - Discharge patient to home.                           - Patient has a contact number available for                            emergencies. The signs and symptoms of potential                            delayed complications were discussed with the                            patient. Return to normal activities tomorrow.                            Written discharge instructions were provided to the                            patient.                           - High fiber diet.                           - Use FiberCon 1-2 tablets PO daily.                           - Continue present medications.                           - Await pathology results.                           - Repeat colonoscopy in 1 year for surveillance if                            evidence of colon 10 or more polyps are                            precancerous adenomatous. Otherwise will be a                            3-year follow-up.                           - The findings and recommendations were discussed                            with the patient.                           -  The findings and recommendations were discussed                            with the patient's family. Justice Britain, MD 09/03/2022 4:18:48 PM

## 2022-09-03 NOTE — Progress Notes (Signed)
GASTROENTEROLOGY PROCEDURE H&P NOTE   Primary Care Physician: Olena Mater, MD  HPI: Diana Farley is a 76 y.o. female who presents for colonoscopy for surveillance adenomatous polyps.  Past Medical History:  Diagnosis Date   Allergy    Anemia    after first childbirth- 1978, no problems since   Anxiety    Arthritis    Asthma    Atypical chest pain Aug. 2006   normal adequate exercise stress Moview; EF 74%, normal 2D echo w/ no significant valvular abnormalities   Cataract    bilateral   Chest pain    Chronic headaches    Dyslipidemia    hypertriglyceridemia/low HDL, on Lipitor.   Exertional dyspnea    Family history of colon cancer    Family history of oral cancer    Family history of ovarian cancer    Family history of prostate cancer    Fibromyalgia    GERD (gastroesophageal reflux disease)    History of supraventricular tachycardia 1999   Hypothyroid    Kidney stones    Migraine headache    followed by Dr. Asencion Partridge Dohmeier in [place]   Osteoporosis    wrist   Other dysphagia    Peptic ulcer disease    pt denies   Personal history of colonic polyps    Pneumonia    Shortness of breath    UTI (urinary tract infection)    Past Surgical History:  Procedure Laterality Date   CATARACT EXTRACTION     both eyes   CESAREAN SECTION     Two   COLONOSCOPY  08/04/2016   dental implants     GASTROCNEMIUS RECESSION Left 08/26/2016   Procedure: GASTROCNEMIUS SLIDE;  Surgeon: Wylene Simmer, MD;  Location: [facility];  Service: Orthopedics;  Laterality: Left;   Left thumb surgery     POLYPECTOMY     TARSAL METATARSAL ARTHRODESIS Left 08/26/2016   Procedure: Left First and second  tarsal metatarsal arthrodesis;  Surgeon: Wylene Simmer, MD;  Location: [facility];  Service: Orthopedics;  Laterality: Left;   TUBAL LIGATION     UPPER GASTROINTESTINAL ENDOSCOPY     Current Outpatient Medications  Medication Sig Dispense Refill   Ascorbic Acid  (VITAMIN C PO) Take by mouth.     clonazePAM (KLONOPIN) 0.5 MG tablet Take 0.5 mg by mouth at bedtime.       levothyroxine (SYNTHROID, LEVOTHROID) 112 MCG tablet Take 112 mcg by mouth daily.       metoprolol (TOPROL-XL) 25 MG 24 hr tablet Take 25 mg by mouth daily. Place on file for patient.      Multiple Vitamin (MULTIVITAMIN) tablet Take 1 tablet by mouth daily.     OVER THE COUNTER MEDICATION Vitamin D 3 one capsule once daily.     senna (SENOKOT) 8.6 MG TABS tablet Take 2 tablets (17.2 mg total) by mouth 2 (two) times daily. 30 each 0   sertraline (ZOLOFT) 100 MG tablet Take 100 mg by mouth daily.     traZODone (DESYREL) 50 MG tablet Take 150 mg by mouth at bedtime.      albuterol (PROVENTIL HFA;VENTOLIN HFA) 108 (90 Base) MCG/ACT inhaler Inhale into the lungs.     budesonide-formoterol (SYMBICORT) 80-4.5 MCG/ACT inhaler Inhale 2 puffs into the lungs 2 (two) times daily as needed.      Current Facility-Administered Medications  Medication Dose Route Frequency Provider Last Rate Last Admin   0.9 %  sodium chloride infusion  500  mL Intravenous Once Mansouraty, Telford Nab., MD        Current Outpatient Medications:    Ascorbic Acid (VITAMIN C PO), Take by mouth., Disp: , Rfl:    clonazePAM (KLONOPIN) 0.5 MG tablet, Take 0.5 mg by mouth at bedtime.  , Disp: , Rfl:    levothyroxine (SYNTHROID, LEVOTHROID) 112 MCG tablet, Take 112 mcg by mouth daily.  , Disp: , Rfl:    metoprolol (TOPROL-XL) 25 MG 24 hr tablet, Take 25 mg by mouth daily. Place on file for patient. , Disp: , Rfl:    Multiple Vitamin (MULTIVITAMIN) tablet, Take 1 tablet by mouth daily., Disp: , Rfl:    OVER THE COUNTER MEDICATION, Vitamin D 3 one capsule once daily., Disp: , Rfl:    senna (SENOKOT) 8.6 MG TABS tablet, Take 2 tablets (17.2 mg total) by mouth 2 (two) times daily., Disp: 30 each, Rfl: 0   sertraline (ZOLOFT) 100 MG tablet, Take 100 mg by mouth daily., Disp: , Rfl:    traZODone (DESYREL) 50 MG tablet, Take 150 mg  by mouth at bedtime. , Disp: , Rfl:    albuterol (PROVENTIL HFA;VENTOLIN HFA) 108 (90 Base) MCG/ACT inhaler, Inhale into the lungs., Disp: , Rfl:    budesonide-formoterol (SYMBICORT) 80-4.5 MCG/ACT inhaler, Inhale 2 puffs into the lungs 2 (two) times daily as needed. , Disp: , Rfl:   Current Facility-Administered Medications:    0.9 %  sodium chloride infusion, 500 mL, Intravenous, Once, Mansouraty, Telford Nab., MD No Known Allergies Family History  Problem Relation Age of Onset   Colon polyps Maternal Aunt    Colon cancer Paternal Uncle        dx 2 x, first time in his 65s   Colon cancer Cousin        maternal first cousin   Stomach cancer Maternal Uncle        dx in his 73s-70   Prostate cancer Maternal Uncle        dx in his 56s-70s   Ovarian cancer Maternal Aunt 35   Ovarian cancer Paternal Aunt 75   Prostate cancer Cousin        maternal first cousin   Head & neck cancer Cousin        maternal first cousin   Esophageal cancer Neg Hx    Rectal cancer Neg Hx    Social History   Socioeconomic History   Marital status: Widowed    Spouse name: Not on file   Number of children: 2   Years of education: Not on file   Highest education level: Not on file  Occupational History   Not on file  Tobacco Use   Smoking status: Former    Packs/day: 1.50    Years: 17.00    Total pack years: 25.50    Types: Cigarettes    Quit date: 05/20/1981    Years since quitting: 41.3   Smokeless tobacco: Never  Vaping Use   Vaping Use: Never used  Substance and Sexual Activity   Alcohol use: Yes    Comment: occ glass of wine with dinner   Drug use: No   Sexual activity: Not on file  Other Topics Concern   Not on file  Social History Narrative   Not on file   Social Determinants of Health   Financial Resource Strain: Not on file  Food Insecurity: Not on file  Transportation Needs: Not on file  Physical Activity: Not on file  Stress: Not on file  Social Connections: Not on file   Intimate Partner Violence: Not on file    Physical Exam: Today's Vitals   09/03/22 1451 09/03/22 1453  BP: 102/67   Pulse: 70   Temp: 98 F (36.7 C) 98 F (36.7 C)  TempSrc: Temporal   SpO2: 97%   Weight: 131 lb (59.4 kg)   Height: '5\' 5"'$  (1.651 m)    Body mass index is 21.8 kg/m. GEN: NAD EYE: Sclerae anicteric ENT: MMM CV: Non-tachycardic GI: Soft, NT/ND NEURO:  Alert & Oriented x 3  Lab Results: No results for input(s): "WBC", "HGB", "HCT", "PLT" in the last 72 hours. BMET No results for input(s): "NA", "K", "CL", "CO2", "GLUCOSE", "BUN", "CREATININE", "CALCIUM" in the last 72 hours. LFT No results for input(s): "PROT", "ALBUMIN", "AST", "ALT", "ALKPHOS", "BILITOT", "BILIDIR", "IBILI" in the last 72 hours. PT/INR No results for input(s): "LABPROT", "INR" in the last 72 hours.   Impression / Plan: This is a 76 y.o.female who presents for colonoscopy for surveillance adenomatous polyps.  The risks and benefits of endoscopic evaluation/treatment were discussed with the patient and/or family; these include but are not limited to the risk of perforation, infection, bleeding, missed lesions, lack of diagnosis, severe illness requiring hospitalization, as well as anesthesia and sedation related illnesses.  The patient's history has been reviewed, patient examined, no change in status, and deemed stable for procedure.  The patient and/or family is agreeable to proceed.    Justice Britain, MD [facility] Office # [phone]

## 2022-09-06 ENCOUNTER — Telehealth: Payer: Self-pay | Admitting: *Deleted

## 2022-09-06 NOTE — Telephone Encounter (Signed)
  Follow up Call-     09/03/2022    2:53 PM  Call back number  Post procedure Call Back phone  # [phone]  Permission to leave phone message Yes     Patient questions:  Do you have a fever, pain , or abdominal swelling? No. Pain Score  0 *  Have you tolerated food without any problems? Yes.    Have you been able to return to your normal activities? Yes.    Do you have any questions about your discharge instructions: Diet   No. Medications  No. Follow up visit  No.  Do you have questions or concerns about your Care? No.  Actions: * If pain score is 4 or above: No action needed, pain <4.

## 2022-09-10 ENCOUNTER — Encounter: Payer: Self-pay | Admitting: Gastroenterology

## 2022-10-14 ENCOUNTER — Other Ambulatory Visit: Payer: Self-pay | Admitting: Obstetrics and Gynecology

## 2022-10-14 DIAGNOSIS — Z1231 Encounter for screening mammogram for malignant neoplasm of breast: Secondary | ICD-10-CM

## 2022-10-27 ENCOUNTER — Other Ambulatory Visit: Payer: Self-pay | Admitting: Orthopedic Surgery

## 2022-10-27 DIAGNOSIS — M79672 Pain in left foot: Secondary | ICD-10-CM

## 2022-11-15 ENCOUNTER — Ambulatory Visit: Payer: Medicare Other

## 2022-11-19 ENCOUNTER — Ambulatory Visit
Admission: RE | Admit: 2022-11-19 | Discharge: 2022-11-19 | Disposition: A | Discharge status: Home / Self Care | Payer: Medicare Other | Source: Ambulatory Visit | Attending: Orthopedic Surgery | Admitting: Orthopedic Surgery

## 2022-11-19 DIAGNOSIS — M79672 Pain in left foot: Secondary | ICD-10-CM

## 2022-11-26 ENCOUNTER — Other Ambulatory Visit: Payer: Medicare Other

## 2022-12-09 ENCOUNTER — Ambulatory Visit
Admission: RE | Admit: 2022-12-09 | Discharge: 2022-12-09 | Disposition: A | Discharge status: Home / Self Care | Payer: Medicare Other | Source: Ambulatory Visit | Attending: Obstetrics and Gynecology | Admitting: Obstetrics and Gynecology

## 2022-12-09 ENCOUNTER — Ambulatory Visit: Payer: Medicare Other

## 2022-12-09 DIAGNOSIS — Z1231 Encounter for screening mammogram for malignant neoplasm of breast: Secondary | ICD-10-CM

## 2022-12-17 DIAGNOSIS — R2689 Other abnormalities of gait and mobility: Secondary | ICD-10-CM | POA: Insufficient documentation

## 2023-05-26 ENCOUNTER — Encounter: Payer: Self-pay | Admitting: Gastroenterology

## 2023-07-26 DIAGNOSIS — G44209 Tension-type headache, unspecified, not intractable: Secondary | ICD-10-CM | POA: Insufficient documentation

## 2023-07-26 DIAGNOSIS — E039 Hypothyroidism, unspecified: Secondary | ICD-10-CM | POA: Insufficient documentation

## 2023-07-26 DIAGNOSIS — F32A Depression, unspecified: Secondary | ICD-10-CM | POA: Insufficient documentation

## 2023-08-08 ENCOUNTER — Ambulatory Visit (AMBULATORY_SURGERY_CENTER): Payer: Medicare Other

## 2023-08-08 ENCOUNTER — Other Ambulatory Visit: Payer: Self-pay

## 2023-08-08 VITALS — Ht 65.0 in | Wt 135.0 lb

## 2023-08-08 DIAGNOSIS — Z86010 Personal history of colonic polyps: Secondary | ICD-10-CM

## 2023-08-08 NOTE — Progress Notes (Signed)
Denies allergies to eggs or soy products. Denies complication of anesthesia or sedation. Denies use of weight loss medication. Denies use of O2.   Emmi instructions given for colonoscopy.  

## 2023-08-25 ENCOUNTER — Other Ambulatory Visit: Payer: Self-pay | Admitting: Obstetrics and Gynecology

## 2023-08-25 DIAGNOSIS — Z0000 Encounter for general adult medical examination without abnormal findings: Secondary | ICD-10-CM

## 2023-09-06 ENCOUNTER — Ambulatory Visit (AMBULATORY_SURGERY_CENTER): Payer: Medicare Other | Admitting: Gastroenterology

## 2023-09-06 ENCOUNTER — Encounter: Payer: Self-pay | Admitting: Gastroenterology

## 2023-09-06 VITALS — BP 132/67 | HR 64 | Temp 98.6°F | Resp 17 | Ht 65.0 in | Wt 135.0 lb

## 2023-09-06 DIAGNOSIS — Z86010 Personal history of colonic polyps: Secondary | ICD-10-CM

## 2023-09-06 DIAGNOSIS — D125 Benign neoplasm of sigmoid colon: Secondary | ICD-10-CM | POA: Diagnosis not present

## 2023-09-06 DIAGNOSIS — Z09 Encounter for follow-up examination after completed treatment for conditions other than malignant neoplasm: Secondary | ICD-10-CM

## 2023-09-06 DIAGNOSIS — K635 Polyp of colon: Secondary | ICD-10-CM

## 2023-09-06 DIAGNOSIS — D123 Benign neoplasm of transverse colon: Secondary | ICD-10-CM | POA: Diagnosis not present

## 2023-09-06 DIAGNOSIS — D128 Benign neoplasm of rectum: Secondary | ICD-10-CM

## 2023-09-06 DIAGNOSIS — D127 Benign neoplasm of rectosigmoid junction: Secondary | ICD-10-CM

## 2023-09-06 MED ORDER — SODIUM CHLORIDE 0.9 % IV SOLN: 500.0000 mL | Freq: Once | INTRAVENOUS | Status: DC

## 2023-09-06 NOTE — Patient Instructions (Signed)
Discharge instructions given. Handouts on polyps and Hemorrhoids. Resume previous medications. Use FiberCon 1-2 tablets everyday. YOU HAD AN ENDOSCOPIC PROCEDURE TODAY AT THE [facility]:   Refer to the procedure report that was given to you for any specific questions about what was found during the examination.  If the procedure report does not answer your questions, please call your gastroenterologist to clarify.  If you requested that your care partner not be given the details of your procedure findings, then the procedure report has been included in a sealed envelope for you to review at your convenience later.  YOU SHOULD EXPECT: Some feelings of bloating in the abdomen. Passage of more gas than usual.  Walking can help get rid of the air that was put into your GI tract during the procedure and reduce the bloating. If you had a lower endoscopy (such as a colonoscopy or flexible sigmoidoscopy) you may notice spotting of blood in your stool or on the toilet paper. If you underwent a bowel prep for your procedure, you may not have a normal bowel movement for a few days.  Please Note:  You might notice some irritation and congestion in your nose or some drainage.  This is from the oxygen used during your procedure.  There is no need for concern and it should clear up in a day or so.  SYMPTOMS TO REPORT IMMEDIATELY:  Following lower endoscopy (colonoscopy or flexible sigmoidoscopy):  Excessive amounts of blood in the stool  Significant tenderness or worsening of abdominal pains  Swelling of the abdomen that is new, acute  Fever of 100F or higher   For urgent or emergent issues, a gastroenterologist can be reached at any hour by calling [phone]. Do not use MyChart messaging for urgent concerns.    DIET:  We do recommend a small meal at first, but then you may proceed to your regular diet.  Drink plenty of fluids but you should avoid alcoholic beverages for 24  hours.  ACTIVITY:  You should plan to take it easy for the rest of today and you should NOT DRIVE or use heavy machinery until tomorrow (because of the sedation medicines used during the test).    FOLLOW UP: Our staff will call the number listed on your records the next business day following your procedure.  We will call around 7:15- 8:00 am to check on you and address any questions or concerns that you may have regarding the information given to you following your procedure. If we do not reach you, we will leave a message.     If any biopsies were taken you will be contacted by phone or by letter within the next 1-3 weeks.  Please call us at [phone] if you have not heard about the biopsies in 3 weeks.    SIGNATURES/CONFIDENTIALITY: You and/or your care partner have signed paperwork which will be entered into your electronic medical record.  These signatures attest to the fact that that the information above on your After Visit Summary has been reviewed and is understood.  Full responsibility of the confidentiality of this discharge information lies with you and/or your care-partner.

## 2023-09-06 NOTE — Progress Notes (Signed)
Pt's states no medical or surgical changes since previsit or office visit. 

## 2023-09-06 NOTE — Progress Notes (Signed)
Uneventful anesthetic. Report to pacu rn. Vss. Care resumed by rn. 

## 2023-09-06 NOTE — Op Note (Signed)
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 The patient's tolerance of                            previous anesthesia was also reviewed. The risks                            and benefits of the procedure and the sedation                            options and risks were discussed with the patient.                            All questions were answered, and informed consent                            was obtained. Prior Anticoagulants: The patient has                            taken no anticoagulant or antiplatelet agents. ASA                            Grade Assessment: II - A patient with mild systemic                            disease. After reviewing the risks and benefits,                            the patient was deemed in satisfactory condition to                            undergo the procedure.  After obtaining informed consent, the colonoscope                            was passed under direct vision. Throughout the                            procedure, the patient's blood pressure, pulse, and                            oxygen saturations were monitored continuously. The                            PCF-HQ190L Colonoscope [id number] was introduced                            through the anus and  advanced to the 2 cm into the                            ileum. The colonoscopy was somewhat difficult due                            to significant looping. Successful completion of                            the procedure was aided by changing the patient to                            a supine position, using manual pressure,                            straightening and shortening the scope to obtain                            bowel loop reduction and using scope torsion. The                            patient tolerated the procedure. The quality of the                            bowel preparation was adequate. The terminal ileum,                            ileocecal valve, appendiceal orifice, and rectum                            were photographed. Scope In: 3:30:34 PM Scope Out: 3:57:38 PM Scope Withdrawal Time: 0 hours 16 minutes 17 seconds  Total Procedure Duration: 0 hours 27 minutes 4 seconds  Findings:                 The digital rectal exam findings include                            hemorrhoids. Pertinent negatives include no  palpable rectal lesions.                           The colon (entire examined portion) revealed                            significantly excessive looping.                           The colon (entire examined portion) was                            significantly redundant.                           A large amount of semi-liquid stool was found in                            the entire colon, interfering with visualization.                            Lavage of the area was performed using copious                            amounts, resulting in clearance with adequate                            visualization.                           Three sessile polyps were found in the rectum,                            sigmoid colon and hepatic flexure. The polyps were                            3 to 4 mm in size. These polyps were removed  with a                            cold snare. Resection and retrieval were complete.                           Normal mucosa was found in the entire colon                            otherwise.                           Non-bleeding non-thrombosed external and internal                            hemorrhoids were found during retroflexion, during                            perianal exam and during digital exam. The  hemorrhoids were Grade II (internal hemorrhoids                            that prolapse but reduce spontaneously). Complications:            No immediate complications. Estimated Blood Loss:     Estimated blood loss was minimal. Impression:               - Hemorrhoids found on digital rectal exam.                           - There was significant looping of the colon.                            Redundant colon.                           - Stool in the entire examined colon -lavaged with                            adequate visualization.                           - Three, 3 to 4 mm polyps in the rectum, in the                            sigmoid colon and at the hepatic flexure, removed                            with a cold snare. Resected and retrieved.                           - Normal mucosa in the entire examined colon                            otherwise.                           - Non-bleeding non-thrombosed external and internal                            hemorrhoids. Recommendation:           - The patient will be observed post-procedure,                            until all discharge criteria are met.                           - Discharge patient to home.                           - Patient has a contact number available for                            emergencies. The signs and symptoms of potential  delayed complications were discussed with the                            patient. Return to normal activities  tomorrow.                            Written discharge instructions were provided to the                            patient.                           - High fiber diet.                           - Use FiberCon 1-2 tablets PO daily.                           - Continue present medications.                           - Await pathology results.                           - Repeat colonoscopy in 3/5/7 years for                            surveillance based on pathology results and based                            on medical conditions/comorbidities at the time.                           - The findings and recommendations were discussed                            with the patient.                           - The findings and recommendations were discussed                            with the patient's family. Corliss Parish, MD 09/06/2023 4:03:34 PM

## 2023-09-06 NOTE — Progress Notes (Signed)
Called to room to assist during endoscopic procedure.  Patient ID and intended procedure confirmed with present staff. Received instructions for my participation in the procedure from the performing physician.  

## 2023-09-06 NOTE — Progress Notes (Signed)
GASTROENTEROLOGY PROCEDURE H&P NOTE   Primary Care Physician: Christena Flake, MD  HPI: Diana Farley is a 77 y.o. female who presents for Colonoscopy for surveillance of previous adenomas.  Past Medical History:  Diagnosis Date   Allergy    Anemia    after first childbirth- 1978, no problems since   Anxiety    Arthritis    Asthma    Atypical chest pain 07/2005   normal adequate exercise stress Moview; EF 74%, normal 2D echo w/ no significant valvular abnormalities   Cataract    bilateral   Chest pain    Chronic headaches    Depression    Dyslipidemia    hypertriglyceridemia/low HDL, on Lipitor.   Exertional dyspnea    Family history of colon cancer    Family history of oral cancer    Family history of ovarian cancer    Family history of prostate cancer    Fibromyalgia    GERD (gastroesophageal reflux disease)    History of supraventricular tachycardia 1999   Hypothyroid    Kidney stones    Migraine headache    followed by Dr. Porfirio Mylar Dohmeier in [place]   Osteoporosis    wrist   Other dysphagia    Peptic ulcer disease    pt denies   Personal history of colonic polyps    Pneumonia    Shortness of breath    UTI (urinary tract infection)    Past Surgical History:  Procedure Laterality Date   CATARACT EXTRACTION     both eyes   CESAREAN SECTION     Two   COLONOSCOPY  08/04/2016   dental implants     GASTROCNEMIUS RECESSION Left 08/26/2016   Procedure: GASTROCNEMIUS SLIDE;  Surgeon: Toni Arthurs, MD;  Location: [facility];  Service: Orthopedics;  Laterality: Left;   Left thumb surgery     POLYPECTOMY     TARSAL METATARSAL ARTHRODESIS Left 08/26/2016   Procedure: Left First and second  tarsal metatarsal arthrodesis;  Surgeon: Toni Arthurs, MD;  Location: [facility];  Service: Orthopedics;  Laterality: Left;   TUBAL LIGATION     UPPER GASTROINTESTINAL ENDOSCOPY     Current Outpatient Medications  Medication Sig Dispense Refill    albuterol (PROVENTIL HFA;VENTOLIN HFA) 108 (90 Base) MCG/ACT inhaler Inhale into the lungs.     Ascorbic Acid (VITAMIN C PO) Take by mouth.     budesonide-formoterol (SYMBICORT) 80-4.5 MCG/ACT inhaler Inhale 2 puffs into the lungs 2 (two) times daily as needed.      clonazePAM (KLONOPIN) 0.5 MG tablet Take 0.5 mg by mouth at bedtime.       levothyroxine (SYNTHROID, LEVOTHROID) 112 MCG tablet Take 112 mcg by mouth daily.       metoprolol (TOPROL-XL) 25 MG 24 hr tablet Take 25 mg by mouth daily. Place on file for patient.      Multiple Vitamin (MULTIVITAMIN) tablet Take 1 tablet by mouth daily.     OVER THE COUNTER MEDICATION Vitamin D 3 one capsule once daily.     OVER THE COUNTER MEDICATION Vitamin E one gel capsule daily.     OVER THE COUNTER MEDICATION Calcium one or two capsule daily.     polyethylene glycol powder (GLYCOLAX/MIRALAX) 17 GM/SCOOP powder Take 1 Container by mouth once.     senna (SENOKOT) 8.6 MG TABS tablet Take 2 tablets (17.2 mg total) by mouth 2 (two) times daily. 30 each 0   sertraline (ZOLOFT) 100 MG tablet Take  100 mg by mouth daily.     traZODone (DESYREL) 50 MG tablet Take 150 mg by mouth at bedtime.      Current Facility-Administered Medications  Medication Dose Route Frequency Provider Last Rate Last Admin   0.9 %  sodium chloride infusion  500 mL Intravenous Once Mansouraty, Netty Starring., MD        Current Outpatient Medications:    albuterol (PROVENTIL HFA;VENTOLIN HFA) 108 (90 Base) MCG/ACT inhaler, Inhale into the lungs., Disp: , Rfl:    Ascorbic Acid (VITAMIN C PO), Take by mouth., Disp: , Rfl:    budesonide-formoterol (SYMBICORT) 80-4.5 MCG/ACT inhaler, Inhale 2 puffs into the lungs 2 (two) times daily as needed. , Disp: , Rfl:    clonazePAM (KLONOPIN) 0.5 MG tablet, Take 0.5 mg by mouth at bedtime.  , Disp: , Rfl:    levothyroxine (SYNTHROID, LEVOTHROID) 112 MCG tablet, Take 112 mcg by mouth daily.  , Disp: , Rfl:    metoprolol (TOPROL-XL) 25 MG 24 hr  tablet, Take 25 mg by mouth daily. Place on file for patient. , Disp: , Rfl:    Multiple Vitamin (MULTIVITAMIN) tablet, Take 1 tablet by mouth daily., Disp: , Rfl:    OVER THE COUNTER MEDICATION, Vitamin D 3 one capsule once daily., Disp: , Rfl:    OVER THE COUNTER MEDICATION, Vitamin E one gel capsule daily., Disp: , Rfl:    OVER THE COUNTER MEDICATION, Calcium one or two capsule daily., Disp: , Rfl:    polyethylene glycol powder (GLYCOLAX/MIRALAX) 17 GM/SCOOP powder, Take 1 Container by mouth once., Disp: , Rfl:    senna (SENOKOT) 8.6 MG TABS tablet, Take 2 tablets (17.2 mg total) by mouth 2 (two) times daily., Disp: 30 each, Rfl: 0   sertraline (ZOLOFT) 100 MG tablet, Take 100 mg by mouth daily., Disp: , Rfl:    traZODone (DESYREL) 50 MG tablet, Take 150 mg by mouth at bedtime. , Disp: , Rfl:   Current Facility-Administered Medications:    0.9 %  sodium chloride infusion, 500 mL, Intravenous, Once, Mansouraty, Netty Starring., MD No Known Allergies Family History  Problem Relation Age of Onset   Colon polyps Maternal Aunt    Colon cancer Paternal Uncle        dx 2 x, first time in his 69s   Colon cancer Cousin        maternal first cousin   Stomach cancer Maternal Uncle        dx in his 71s-70   Prostate cancer Maternal Uncle        dx in his 10s-70s   Ovarian cancer Maternal Aunt 68   Ovarian cancer Paternal Aunt 88   Prostate cancer Cousin        maternal first cousin   Head & neck cancer Cousin        maternal first cousin   Esophageal cancer Neg Hx    Rectal cancer Neg Hx    Social History   Socioeconomic History   Marital status: Widowed    Spouse name: Not on file   Number of children: 2   Years of education: Not on file   Highest education level: Not on file  Occupational History   Not on file  Tobacco Use   Smoking status: Former    Current packs/day: 0.00    Average packs/day: 1.5 packs/day for 17.0 years (25.5 ttl pk-yrs)    Types: Cigarettes    Start date:  05/20/1964    Quit date: 05/20/1981  Years since quitting: 42.3   Smokeless tobacco: Never  Vaping Use   Vaping status: Never Used  Substance and Sexual Activity   Alcohol use: Yes    Comment: occ glass of wine with dinner   Drug use: No   Sexual activity: Not on file  Other Topics Concern   Not on file  Social History Narrative   Not on file   Social Determinants of Health   Financial Resource Strain: Not on file  Food Insecurity: Not on file  Transportation Needs: Not on file  Physical Activity: Not on file  Stress: Not on file  Social Connections: Not on file  Intimate Partner Violence: Not on file    Physical Exam: There were no vitals filed for this visit. There is no height or weight on file to calculate BMI. GEN: NAD EYE: Sclerae anicteric ENT: MMM CV: Non-tachycardic GI: Soft, NT/ND NEURO:  Alert & Oriented x 3  Lab Results: No results for input(s): "WBC", "HGB", "HCT", "PLT" in the last 72 hours. BMET No results for input(s): "NA", "K", "CL", "CO2", "GLUCOSE", "BUN", "CREATININE", "CALCIUM" in the last 72 hours. LFT No results for input(s): "PROT", "ALBUMIN", "AST", "ALT", "ALKPHOS", "BILITOT", "BILIDIR", "IBILI" in the last 72 hours. PT/INR No results for input(s): "LABPROT", "INR" in the last 72 hours.   Impression / Plan: This is a 77 y.o.female who presents for Colonoscopy for surveillance of previous adenomas.  The risks and benefits of endoscopic evaluation/treatment were discussed with the patient and/or family; these include but are not limited to the risk of perforation, infection, bleeding, missed lesions, lack of diagnosis, severe illness requiring hospitalization, as well as anesthesia and sedation related illnesses.  The patient's history has been reviewed, patient examined, no change in status, and deemed stable for procedure.  The patient and/or family is agreeable to proceed.    Corliss Parish, MD  Gastroenterology Advanced  Endoscopy Office # [phone]

## 2023-09-07 ENCOUNTER — Telehealth: Payer: Self-pay

## 2023-09-07 NOTE — Telephone Encounter (Signed)
  Follow up Call-     09/06/2023    2:40 PM 09/03/2022    2:53 PM  Call back number  Post procedure Call Back phone  # [phone]  Permission to leave phone message Yes Yes     Patient questions:  Do you have a fever, pain , or abdominal swelling? No. Pain Score  0 *  Have you tolerated food without any problems? Yes.    Have you been able to return to your normal activities? Yes.    Do you have any questions about your discharge instructions: Diet   No. Medications  No. Follow up visit  No.  Do you have questions or concerns about your Care? No  Actions: * If pain score is 4 or above: No action needed, pain <4.

## 2023-09-16 LAB — SURGICAL PATHOLOGY

## 2023-09-17 ENCOUNTER — Encounter: Payer: Self-pay | Admitting: Gastroenterology

## 2023-12-12 ENCOUNTER — Ambulatory Visit
Admission: RE | Admit: 2023-12-12 | Discharge: 2023-12-12 | Disposition: A | Discharge status: Home / Self Care | Payer: Medicare Other | Source: Ambulatory Visit | Attending: Obstetrics and Gynecology | Admitting: Obstetrics and Gynecology

## 2023-12-12 DIAGNOSIS — Z0000 Encounter for general adult medical examination without abnormal findings: Secondary | ICD-10-CM

## 2024-04-03 ENCOUNTER — Telehealth: Payer: Self-pay | Admitting: Gastroenterology

## 2024-04-03 NOTE — Telephone Encounter (Signed)
 Patient called and stated that she is having trouble with swallowing and it feels like she has something in her throat. Patient is requesting a call back. Please advise.

## 2024-04-04 NOTE — Telephone Encounter (Signed)
 Spoke with the pt and she has some issues with swallowing. She was told by her PCP that she needs to see an endocrinologist for possible mass on parathyroid.  She doeshave that appt set up.  She would also like to be set up for appt with Dr Brice Campi for follow up.   I have transferred her to the schedulers to set up.

## 2024-06-05 ENCOUNTER — Ambulatory Visit: Admitting: Gastroenterology

## 2024-06-05 ENCOUNTER — Encounter: Payer: Self-pay | Admitting: Gastroenterology

## 2024-06-05 VITALS — BP 100/60 | HR 73 | Ht 65.0 in | Wt 135.0 lb

## 2024-06-05 DIAGNOSIS — R09A2 Foreign body sensation, throat: Secondary | ICD-10-CM | POA: Diagnosis not present

## 2024-06-05 DIAGNOSIS — K5900 Constipation, unspecified: Secondary | ICD-10-CM

## 2024-06-05 DIAGNOSIS — R1310 Dysphagia, unspecified: Secondary | ICD-10-CM | POA: Diagnosis not present

## 2024-06-05 NOTE — Patient Instructions (Signed)
 We have given you samples of the following medication to take: Linzess  145 mcg - 1 pill by mouth once daily.   Please purchase the following medications over the counter and take as directed: Miralax- dissolve 1 capful in at least 8 ounces of water 1-2 times daily as needed.   You have been scheduled for an endoscopy. Please follow written instructions given to you at your visit today.  If you use inhalers (even only as needed), please bring them with you on the day of your procedure.  If you take any of the following medications, they will need to be adjusted prior to your procedure:   DO NOT TAKE 7 DAYS PRIOR TO TEST- Trulicity (dulaglutide) Ozempic, Wegovy (semaglutide) Mounjaro (tirzepatide) Bydureon Bcise (exanatide extended release)  DO NOT TAKE 1 DAY PRIOR TO YOUR TEST Rybelsus (semaglutide) Adlyxin (lixisenatide) Victoza (liraglutide) Byetta (exanatide) ___________________________________________________________________________   Due to recent changes in healthcare laws, you may see the results of your imaging and laboratory studies on MyChart before your provider has had a chance to review them.  We understand that in some cases there may be results that are confusing or concerning to you. Not all laboratory results come back in the same time frame and the provider may be waiting for multiple results in order to interpret others.  Please give us  48 hours in order for your provider to thoroughly review all the results before contacting the office for clarification of your results.   Thank you for choosing me and  Gastroenterology.  Dr. Brice Campi

## 2024-06-08 ENCOUNTER — Encounter: Payer: Self-pay | Admitting: Gastroenterology

## 2024-06-08 DIAGNOSIS — R09A2 Foreign body sensation, throat: Secondary | ICD-10-CM | POA: Insufficient documentation

## 2024-06-08 DIAGNOSIS — K5900 Constipation, unspecified: Secondary | ICD-10-CM | POA: Insufficient documentation

## 2024-06-08 DIAGNOSIS — R1310 Dysphagia, unspecified: Secondary | ICD-10-CM | POA: Insufficient documentation

## 2024-06-08 NOTE — Progress Notes (Signed)
 GASTROENTEROLOGY OUTPATIENT CLINIC VISIT   Primary Care Provider [place], Sammie Crigler, MD [address] [place] [address] [phone]  Patient Profile: Diana Farley is a 78 y.o. female with a pmh significant for Hypothyroidism, asthma, anxiety, colon polyps (TA's), constipation, hemorrhoids.  The patient presents to the [facility] for an evaluation and management of problem(s) noted below:  Problem List 1. Globus sensation   2. Dysphagia, unspecified type   3. Constipation, unspecified constipation type    Discussed the use of AI scribe software for clinical note transcription with the patient, who gave verbal consent to proceed.  History of Present Illness Please see prior GI notes for full details of HPI.  Interval History The patient returns for follow-up with sensation of globus, mild dysphagia and issues of persisting constipation.   She has been experiencing a sensation of fullness in her throat for the past four to five months. There is no history of overt solid food dysphagia but there have been 2 episodes of choking on peanuts, attributed to not chewing well and having to cough/regurgitate to get the peanut out.  She denies overt heartburn or epigastric burning prior to PCP initiating PPI therapy.  She is not sure if she is having any overt benefit with this.  She denies odynophagia.  She is being worked up for a parathyroid nodule with consideration and further evaluation as to whether she may need surgical management and wonders if her symptoms could be from this.  She reports constipation, managed with two Fibercon tablets daily was initially effective but now she experiences bowel movements every four days, leading to bloating and distention and discomfort.  She had used Senokot in the past which would move her bowels every other day.  She has not used MiraLAX regularly.  No unexplained weight loss, stroke, or neurological issues. Her weight has  been stable, and she is physically active.   GI Review of Systems Positive as above Negative for melena, hematochezia  Review of Systems General: Denies fevers/chills/weight loss unintentionally Cardiovascular: Denies chest pain Pulmonary: Denies shortness of breath Gastroenterological: See HPI Genitourinary: Denies darkened urine Hematological: Denies easy bruising/bleeding Dermatological: Denies jaundice Psychological: Mood is stable  Medications Current Outpatient Medications  Medication Sig Dispense Refill   albuterol  (PROVENTIL  HFA;VENTOLIN  HFA) 108 (90 Base) MCG/ACT inhaler Inhale into the lungs. (Patient taking differently: Inhale into the lungs as needed.)     Ascorbic Acid (VITAMIN C PO) Take by mouth.     budesonide-formoterol (SYMBICORT) 80-4.5 MCG/ACT inhaler Inhale 2 puffs into the lungs 2 (two) times daily as needed.  (Patient taking differently: Inhale 2 puffs into the lungs as needed.)     clonazePAM (KLONOPIN) 0.5 MG tablet Take 0.5 mg by mouth at bedtime.       levothyroxine (SYNTHROID, LEVOTHROID) 112 MCG tablet Take 112 mcg by mouth daily.       metoprolol  (TOPROL -XL) 25 MG 24 hr tablet Take 25 mg by mouth daily. Place on file for patient.      Multiple Vitamin (MULTIVITAMIN) tablet Take 1 tablet by mouth daily.     omeprazole (PRILOSEC) 20 MG capsule Take 20 mg by mouth daily.     OVER THE COUNTER MEDICATION Vitamin D 3 one capsule once daily.     OVER THE COUNTER MEDICATION Vitamin E one gel capsule daily.     OVER THE COUNTER MEDICATION Calcium one or two capsule daily.     senna (SENOKOT) 8.6 MG TABS tablet Take 2 tablets (17.2  mg total) by mouth 2 (two) times daily. 30 each 0   sertraline (ZOLOFT) 100 MG tablet Take 100 mg by mouth daily.     traZODone (DESYREL) 50 MG tablet Take 150 mg by mouth at bedtime.      No current facility-administered medications for this visit.    Allergies No Known Allergies  Histories Past Medical History:  Diagnosis  Date   Allergy    Anemia    after first childbirth- 1978, no problems since   Anxiety    Arthritis    Asthma    Atypical chest pain 07/2005   normal adequate exercise stress Moview; EF 74%, normal 2D echo w/ no significant valvular abnormalities   Cataract    bilateral   Chest pain    Chronic headaches    Depression    Dyslipidemia    hypertriglyceridemia/low HDL, on Lipitor.   Exertional dyspnea    Family history of colon cancer    Family history of oral cancer    Family history of ovarian cancer    Family history of prostate cancer    Fibromyalgia    GERD (gastroesophageal reflux disease)    History of supraventricular tachycardia 1999   Hypothyroid    Kidney stones    Migraine headache    followed by Dr. Raoul Byes Dohmeier in [place]   Osteoporosis    wrist   Other dysphagia    Peptic ulcer disease    pt denies   Personal history of colonic polyps    Pneumonia    Shortness of breath    UTI (urinary tract infection)    Past Surgical History:  Procedure Laterality Date   CATARACT EXTRACTION     both eyes   CESAREAN SECTION     Two   COLONOSCOPY  08/04/2016   dental implants     GASTROCNEMIUS RECESSION Left 08/26/2016   Procedure: GASTROCNEMIUS SLIDE;  Surgeon: Amada Backer, MD;  Location: [facility];  Service: Orthopedics;  Laterality: Left;   Left thumb surgery     POLYPECTOMY     TARSAL METATARSAL ARTHRODESIS Left 08/26/2016   Procedure: Left First and second  tarsal metatarsal arthrodesis;  Surgeon: Amada Backer, MD;  Location: [facility];  Service: Orthopedics;  Laterality: Left;   TUBAL LIGATION     UPPER GASTROINTESTINAL ENDOSCOPY     Social History   Socioeconomic History   Marital status: Widowed    Spouse name: Not on file   Number of children: 2   Years of education: Not on file   Highest education level: Not on file  Occupational History   Not on file  Tobacco Use   Smoking status: Former    Current packs/day: 0.00     Average packs/day: 1.5 packs/day for 17.0 years (25.5 ttl pk-yrs)    Types: Cigarettes    Start date: 05/20/1964    Quit date: 05/20/1981    Years since quitting: 43.0   Smokeless tobacco: Never  Vaping Use   Vaping status: Never Used  Substance and Sexual Activity   Alcohol use: Yes    Comment: occ glass of wine with dinner   Drug use: No   Sexual activity: Not on file  Other Topics Concern   Not on file  Social History Narrative   Not on file   Social Drivers of Health   Financial Resource Strain: Not on file  Food Insecurity: Not on file  Transportation Needs: Not on file  Physical Activity: Not  on file  Stress: Not on file  Social Connections: Not on file  Intimate Partner Violence: Not on file   Family History  Problem Relation Age of Onset   Colon polyps Maternal Aunt    Ovarian cancer Maternal Aunt 15   Stomach cancer Maternal Uncle        dx in his 47s-70   Prostate cancer Maternal Uncle        dx in his 12s-70s   Ovarian cancer Paternal Aunt 56   Colon cancer Paternal Uncle        dx 2 x, first time in his 42s   Colon cancer Cousin        maternal first cousin   Prostate cancer Cousin        maternal first cousin   Head & neck cancer Cousin        maternal first cousin   Esophageal cancer Neg Hx    Rectal cancer Neg Hx    Inflammatory bowel disease Neg Hx    Liver disease Neg Hx    Pancreatic cancer Neg Hx    I have reviewed her medical, social, and family history in detail and updated the electronic medical record as necessary.    PHYSICAL EXAMINATION  BP 100/60   Pulse 73   Ht 5' 5 (1.651 m)   Wt 135 lb (61.2 kg)   BMI 22.47 kg/m  Wt Readings from Last 3 Encounters:  06/05/24 135 lb (61.2 kg)  09/06/23 135 lb (61.2 kg)  08/08/23 135 lb (61.2 kg)  GEN: NAD, appears stated age, doesn't appear chronically ill, accompanied by daughter PSYCH: Cooperative, without pressured speech EYE: Conjunctivae pink, sclerae anicteric ENT: MMM CV:  Nontachycardic RESP: No audible wheezing GI: NABS, soft, NT/ND, without rebound or guarding GU: DRE shows MSK/EXT: No significant lower extremity edema SKIN: No jaundice NEURO:  Alert & Oriented x 3, no focal deficits   REVIEW OF DATA  I reviewed the following data at the time of this encounter:  GI Procedures and Studies  Previously reviewed  Laboratory Studies  Reviewed those in epic  Imaging Studies  No relevant studies to review   ASSESSMENT  Ms. Lady is a 78 y.o. female with a pmh significant for Hypothyroidism, asthma, anxiety, colon polyps (TA's), constipation, hemorrhoids.  The patient is seen today for evaluation and management of:  1. Globus sensation   2. Dysphagia, unspecified type   3. Constipation, unspecified constipation type    The patient is hemodynamically stable.  Clinically, the etiology of her globus sensation is not clearly defined there is some mild dysphagia.  Upper endoscopy is recommended based on her age to ensure nothing else is being missed.  Dilation can be performed.  If she continues to have symptomatology then we will recommend modified barium swallow and SLP evaluation.  I do not believe that her parathyroid nodule is causing issues based on size that has been documented in the system.  Clinically, her constipation is persisting.  Will ask patient to initiate MiraLAX and if not helpful she can trial Linzess  or other laxative therapies.  Otherwise she may go back to using Senokot as she had been doing before.  PLAN  For now may continue PPI Proceed with scheduling EGD with biopsies and dilation If negative workup and continued symptoms then will need modified barium swallow and SLP evaluation Continue FiberCon Initiate MiraLAX daily to twice daily If not effective may restart Senokot versus use Linzess  samples 145 mcg - Update  us  if prescription is helpful we can send the prescription or try other medicines   Orders Placed This Encounter   Procedures   Ambulatory referral to Gastroenterology    New Prescriptions   No medications on file   Modified Medications   No medications on file    Planned Follow Up No follow-ups on file.   Total Time in Face-to-Face and in Coordination of Care for patient including independent/personal interpretation/review of prior testing, medical history, examination, medication adjustment, communicating results with the patient directly, and documentation within the EHR is 25 minutes.   Yong Henle, MD [facility] Office # [phone]

## 2024-06-15 ENCOUNTER — Telehealth: Payer: Self-pay | Admitting: Gastroenterology

## 2024-06-15 ENCOUNTER — Other Ambulatory Visit: Payer: Self-pay | Admitting: Gastroenterology

## 2024-06-15 MED ORDER — LINACLOTIDE 145 MCG PO CAPS
145.0000 ug | ORAL_CAPSULE | Freq: Every day | ORAL | 3 refills | Status: AC
Start: 2024-06-15 — End: 2025-06-10

## 2024-06-15 NOTE — Telephone Encounter (Signed)
 Spoke with patient this afternoon. She would like to try and get patient assistance for Linzess  because its helping. We will also send prescription to see if we can get it approved with [place] first. I have placed a patient assistance form from my [employer] in the mail to the patient. I will try to get samples next week for the patient as well. Patient voiced understanding.

## 2024-06-15 NOTE — Telephone Encounter (Signed)
 Inbound call from patient, states Linzess  is not covered by her insurance but is working. Patient would like to try a different medication that would be covered or a program she can use to lower insurance cost for linzess .

## 2024-06-18 ENCOUNTER — Other Ambulatory Visit (HOSPITAL_COMMUNITY): Payer: Self-pay

## 2024-06-18 NOTE — Telephone Encounter (Signed)
 Patient has deductible to meet. As well, insurance states that she may be eligible for the Medicare Prescription Payment Plan Program. She will need to contact her insurance for further information and to sign up. (This allows the patient to split up costs from now to end of year)

## 2024-06-29 NOTE — Telephone Encounter (Signed)
 Left message. Following- up on application.

## 2024-07-06 ENCOUNTER — Encounter: Admitting: Gastroenterology

## 2024-08-23 ENCOUNTER — Other Ambulatory Visit: Payer: Self-pay | Admitting: Obstetrics and Gynecology

## 2024-08-23 DIAGNOSIS — Z1231 Encounter for screening mammogram for malignant neoplasm of breast: Secondary | ICD-10-CM

## 2024-08-23 NOTE — Telephone Encounter (Signed)
 Spoke patient today. She states that she has been in contact with my [employer] and will let us  know if she needs anything further.

## 2025-01-02 ENCOUNTER — Other Ambulatory Visit: Payer: Self-pay | Admitting: Obstetrics and Gynecology

## 2025-01-02 DIAGNOSIS — N644 Mastodynia: Secondary | ICD-10-CM

## 2025-01-10 ENCOUNTER — Ambulatory Visit
Admission: RE | Admit: 2025-01-10 | Discharge: 2025-01-10 | Disposition: A | Discharge status: Home / Self Care | Source: Ambulatory Visit | Attending: Obstetrics and Gynecology | Admitting: Obstetrics and Gynecology

## 2025-01-10 ENCOUNTER — Ambulatory Visit: Admission: RE | Admit: 2025-01-10 | Source: Ambulatory Visit

## 2025-01-10 DIAGNOSIS — N644 Mastodynia: Secondary | ICD-10-CM
